# Patient Record
Sex: Male | Born: 1960 | Race: White | Hispanic: No | Marital: Married | Smoking: Never smoker
Health system: Southern US, Community
[De-identification: ages and names within clinical notes are randomized; demographics above are authoritative.]

## PROBLEM LIST (undated history)

## (undated) DIAGNOSIS — I10 Essential (primary) hypertension: Secondary | ICD-10-CM

---

## 2018-08-13 ENCOUNTER — Other Ambulatory Visit: Payer: Self-pay

## 2018-08-13 ENCOUNTER — Emergency Department (HOSPITAL_COMMUNITY): Payer: Self-pay

## 2018-08-13 ENCOUNTER — Emergency Department (HOSPITAL_COMMUNITY)
Admission: EM | Admit: 2018-08-13 | Discharge: 2018-08-14 | Disposition: A | Discharge status: Home / Self Care | Payer: Self-pay | Attending: Emergency Medicine | Admitting: Emergency Medicine

## 2018-08-13 DIAGNOSIS — M25561 Pain in right knee: Secondary | ICD-10-CM | POA: Insufficient documentation

## 2018-08-13 NOTE — ED Provider Notes (Signed)
[facility]-EMERGENCY DEPT Provider Note   CSN: [id number] Arrival date & time: 08/13/18  2043     History   Chief Complaint Chief Complaint  Patient presents with  . Knee Pain    R    HPI Marcus Scott is a 57 y.o. male.  The history is provided by the patient and medical records.  Knee Pain       57 y.o. M here with right knee pain.  States he was standing on top of a 4 rung ladder and was attempting to back down when he missed the bottom rung, fell to the ground and feels like he hyperextended his knee.  States he tried to get up and walk but his knee felt very "wobbly".  Did have a set of crutches at home which he used to help assist him to the car.  States if he lays his leg in a certain position he has excruciating pain, otherwise no pain.  He denies any numbness or weakness of his right leg.  No prior right knee injuries or surgeries in the past.  No past medical history on file.  There are no active problems to display for this patient.      Home Medications    Prior to Admission medications   Not on File    Family History No family history on file.  Social History Social History   Tobacco Use  . Smoking status: Not on file  Substance Use Topics  . Alcohol use: Not on file  . Drug use: Not on file     Allergies   Patient has no known allergies.   Review of Systems Review of Systems  Musculoskeletal: Positive for arthralgias and joint swelling.  All other systems reviewed and are negative.    Physical Exam Updated Vital Signs BP (!) 209/126 (BP Location: Left Arm)   Pulse 83   SpO2 97%   Physical Exam  Constitutional: He is oriented to person, place, and time. He appears well-developed and well-nourished.  HENT:  Head: Normocephalic and atraumatic.  Mouth/Throat: Oropharynx is clear and moist.  Eyes: Pupils are equal, round, and reactive to light. Conjunctivae and EOM are normal.  Neck: Normal range of  motion.  Cardiovascular: Normal rate, regular rhythm and normal heart sounds.  Pulmonary/Chest: Effort normal and breath sounds normal. No stridor. No respiratory distress.  Abdominal: Soft. Bowel sounds are normal. There is no tenderness. There is no rebound.  Musculoskeletal: Normal range of motion.  Right knee with small prepatellar effusion noted, there is no significant bony deformity, pain with attempted flexion but comfortable in full extension, no pain with varus or valgus stress, negative anterior drawer, pain with posterior drawer test; normal distal sensation and perfusion, moving toes normally  Neurological: He is alert and oriented to person, place, and time.  Skin: Skin is warm and dry.  Psychiatric: He has a normal mood and affect.  Nursing note and vitals reviewed.    ED Treatments / Results  Labs (all labs ordered are listed, but only abnormal results are displayed) Labs Reviewed - No data to display  EKG None  Radiology Dg Knee Complete 4 Views Right  Result Date: 08/13/2018 CLINICAL DATA:  Fall from ladder hyperextending right knee. EXAM: RIGHT KNEE - COMPLETE 4+ VIEW COMPARISON:  None. FINDINGS: No fracture or dislocation. No joint effusion or evidence of lipohemarthrosis. 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 There is minimal spurring pf  the tibial spines. No evidence of chondrocalcinosis. Regional soft tissues appear normal. IMPRESSION: 1. No acute findings. 2. 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 Electronically Signed   By: Simonne Come M.D.   On: 08/13/2018 21:57    Procedures Procedures (including critical care time)  Medications Ordered in ED Medications - No data to display   Initial Impression / Assessment and Plan / ED Course  I have reviewed the triage vital signs and the nursing notes.  Pertinent labs & imaging results that were available during my care of  the patient were reviewed by me and considered in my medical decision making (see chart for details).  57 year old male here with right knee pain after slipping off the running of a ladder.  Fell and hyperextended right knee.  No head injury or loss of consciousness.  States when trying to get up and walk knee feels very "wobbly".  Denies any numbness or weakness.  Right knee does have small prepatellar effusion but no acute deformity.  Has pain with posterior drawer but negative anterior drawer and no pain with varus or valgus stress.  X-ray is negative.  Given the nature of his injury and findings on exam concern for possible internal derangement.  We will place a knee immobilizer.  He has crutches at home that he will use.  We will have him ice and elevate at home as well as pain control.  He will follow-up closely with orthopedics.  He will return here for any new or worsening symptoms.  Final Clinical Impressions(s) / ED Diagnoses   Final diagnoses:  Acute pain of right knee    ED Discharge Orders         Ordered    oxyCODONE-acetaminophen (PERCOCET) 5-325 MG tablet  Every 4 hours PRN     08/14/18 0032    ibuprofen (ADVIL,MOTRIN) 800 MG tablet  3 times daily     08/14/18 0032           Garlon Hatchet, PA-C 08/14/18 0036    Gilda Crease, MD 08/14/18 [phone]

## 2018-08-13 NOTE — ED Triage Notes (Signed)
Pt reports that he missed 2 rungs on a ladder and slid down twisting his R knee in the process. He denies any other injury. Unable to bear weight on his R knee. Pt is able to wiggle toes and move his ankle. A&Ox4.

## 2018-08-14 MED ORDER — IBUPROFEN 800 MG PO TABS: 800.0000 mg | ORAL_TABLET | Freq: Three times a day (TID) | ORAL | 0 refills | Status: DC

## 2018-08-14 MED ORDER — OXYCODONE-ACETAMINOPHEN 5-325 MG PO TABS: 1.0000 | ORAL_TABLET | ORAL | 0 refills | Status: DC | PRN

## 2018-08-14 NOTE — ED Notes (Signed)
Pt aware of bp and stated "they would rather follow up with their pcp." Verbalized understanding of discharge paperwork and follow up care

## 2018-08-14 NOTE — Discharge Instructions (Signed)
Take the prescribed medication as directed.  Ice and elevate knee at home to help with pain control. Follow-up with orthopedics-- call for appt in the morning. Return to the ED for new or worsening symptoms.

## 2018-08-28 ENCOUNTER — Other Ambulatory Visit: Payer: Self-pay | Admitting: Physician Assistant

## 2018-08-28 DIAGNOSIS — M25561 Pain in right knee: Secondary | ICD-10-CM

## 2018-09-01 ENCOUNTER — Ambulatory Visit
Admission: RE | Admit: 2018-09-01 | Discharge: 2018-09-01 | Disposition: A | Discharge status: Home / Self Care | Payer: No Typology Code available for payment source | Source: Ambulatory Visit | Attending: Physician Assistant | Admitting: Physician Assistant

## 2018-09-01 DIAGNOSIS — M25561 Pain in right knee: Secondary | ICD-10-CM

## 2019-05-15 IMAGING — CR DG KNEE COMPLETE 4+V*R*
4 series · 4 of 4 positions shown · non-contrast
Comparison: None.

CLINICAL DATA: Fall from ladder hyperextending right knee.

EXAM:
RIGHT KNEE - COMPLETE 4+ VIEW

[t knee ap right]
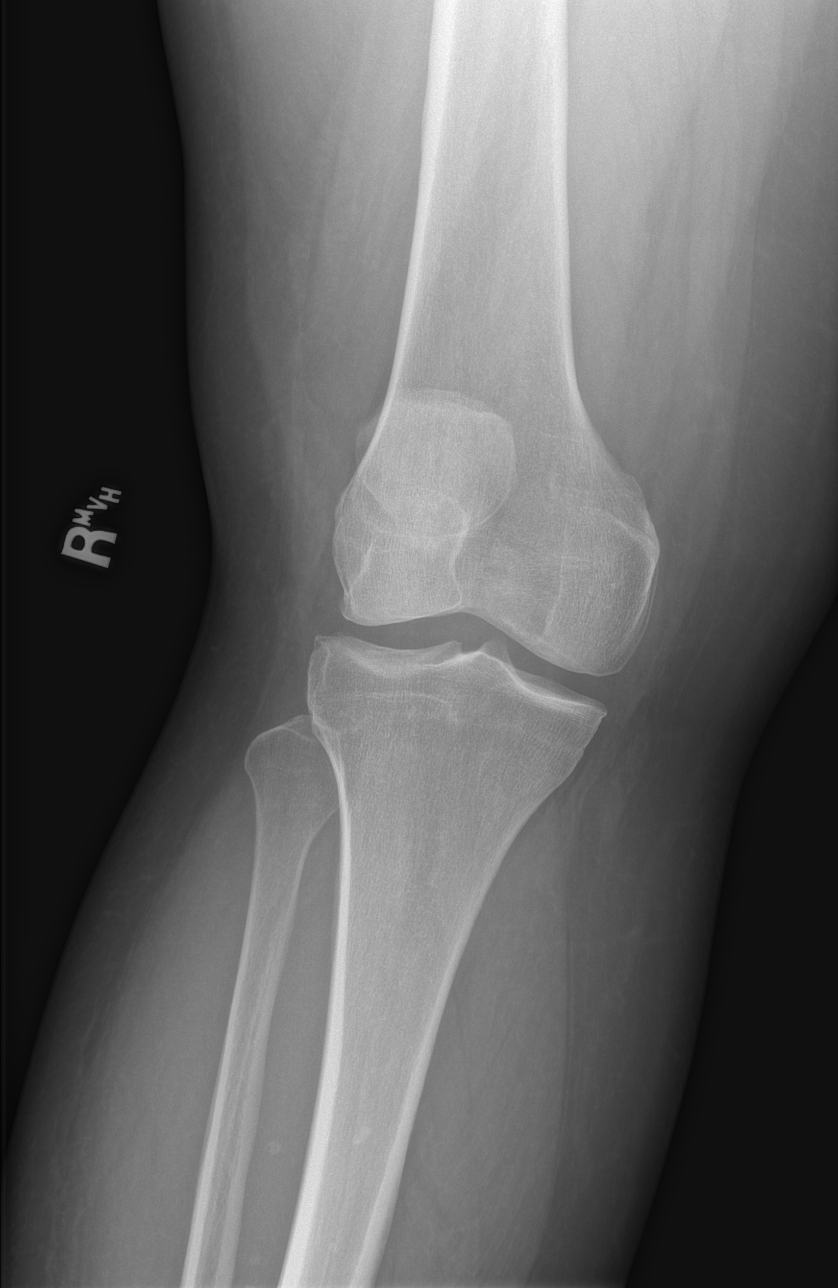

[t knee obl right (1 of 2)]
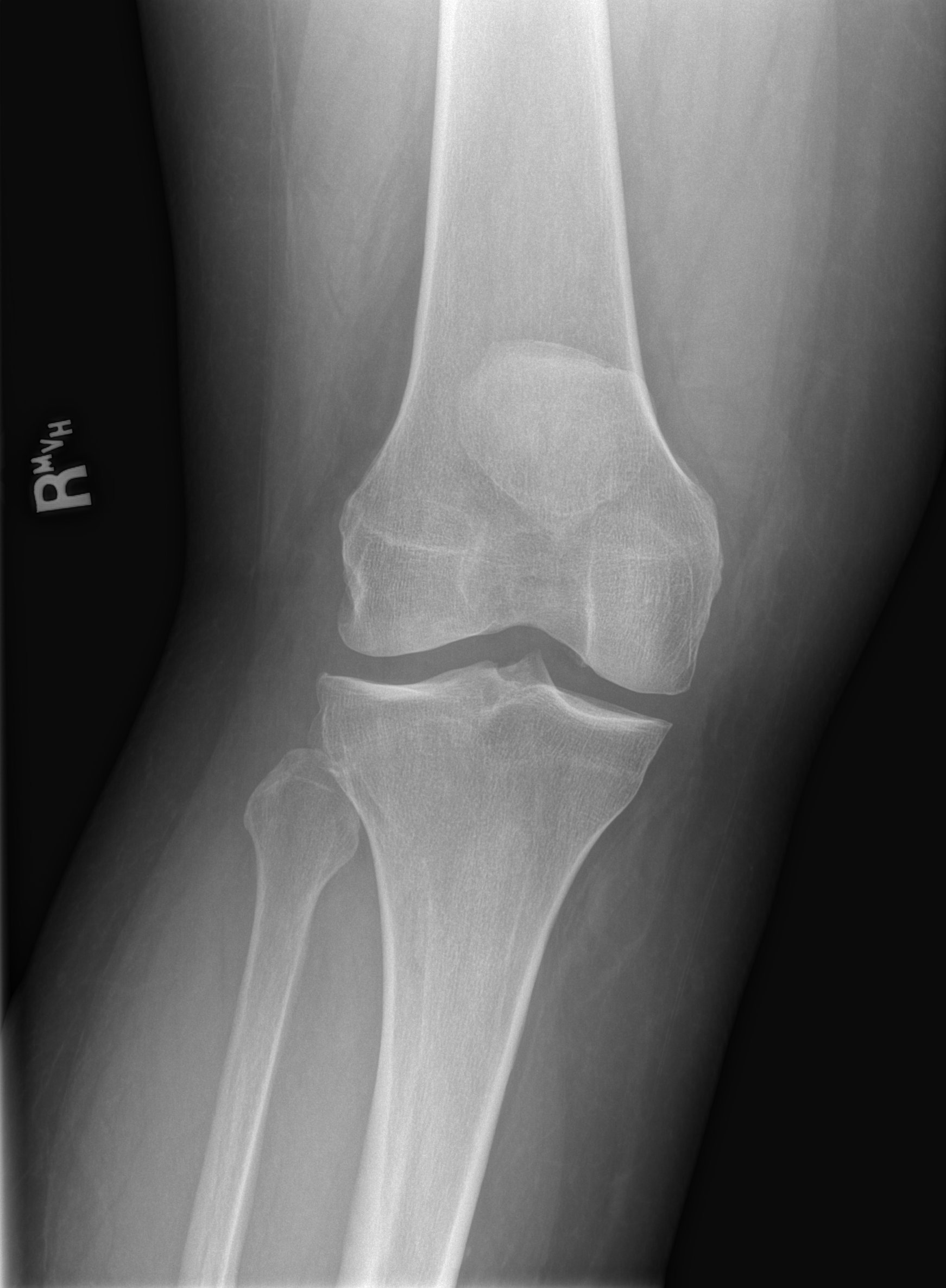

[t knee obl right (2 of 2)]
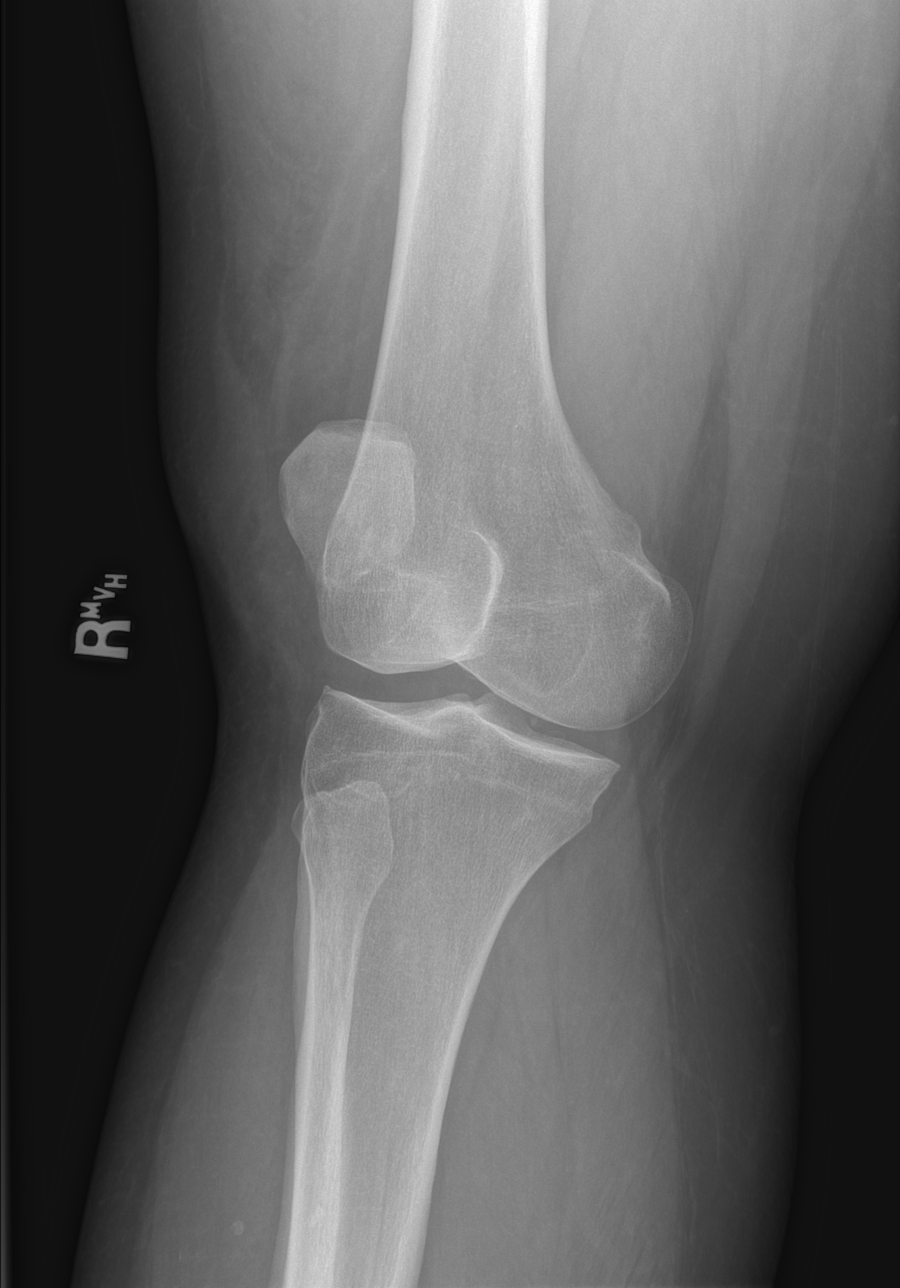

[x knee ap right]
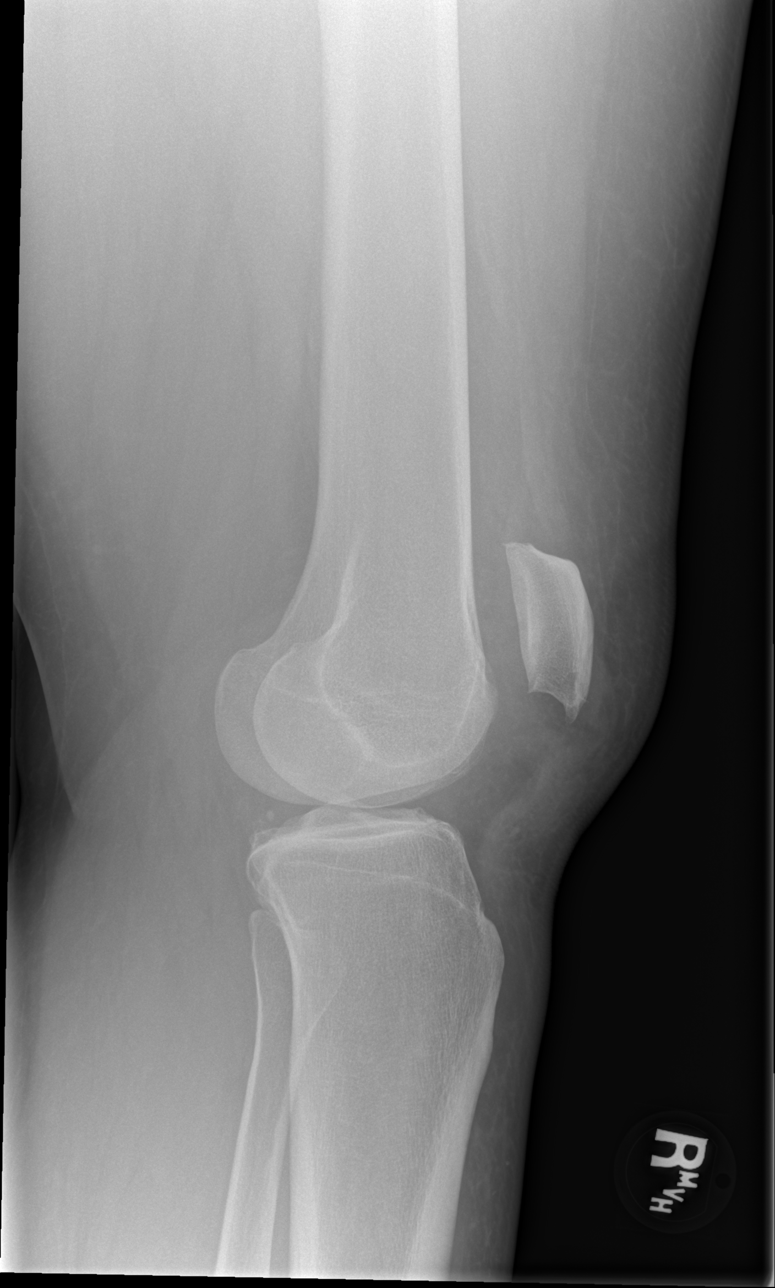

[4 of 4 positions shown; findings below may reference images not displayed]

FINDINGS: No fracture or dislocation. No joint effusion or evidence of
lipohemarthrosis.

Mild tricompartmental degenerative change of the knee with joint
space loss, subchondral sclerosis osteophytosis. There is minimal
spurring pf the tibial spines. No evidence of chondrocalcinosis.
Regional soft tissues appear normal.
IMPRESSION: 1. No acute findings.
2. Mild tricompartmental degenerative change of the knee.

## 2019-10-03 HISTORY — PX: PATELLA RECONSTRUCTION: SHX736

## 2024-01-19 ENCOUNTER — Encounter (HOSPITAL_BASED_OUTPATIENT_CLINIC_OR_DEPARTMENT_OTHER): Payer: Self-pay | Admitting: Emergency Medicine

## 2024-01-19 ENCOUNTER — Emergency Department (HOSPITAL_BASED_OUTPATIENT_CLINIC_OR_DEPARTMENT_OTHER)
Admission: EM | Admit: 2024-01-19 | Discharge: 2024-01-19 | Disposition: A | Discharge status: Home / Self Care | Payer: Self-pay | Attending: Emergency Medicine | Admitting: Emergency Medicine

## 2024-01-19 ENCOUNTER — Other Ambulatory Visit: Payer: Self-pay

## 2024-01-19 ENCOUNTER — Emergency Department (HOSPITAL_BASED_OUTPATIENT_CLINIC_OR_DEPARTMENT_OTHER): Payer: Self-pay

## 2024-01-19 DIAGNOSIS — S66821A Laceration of other specified muscles, fascia and tendons at wrist and hand level, right hand, initial encounter: Secondary | ICD-10-CM | POA: Insufficient documentation

## 2024-01-19 DIAGNOSIS — S61411A Laceration without foreign body of right hand, initial encounter: Secondary | ICD-10-CM

## 2024-01-19 DIAGNOSIS — W312XXA Contact with powered woodworking and forming machines, initial encounter: Secondary | ICD-10-CM | POA: Insufficient documentation

## 2024-01-19 MED ORDER — CEPHALEXIN 500 MG PO CAPS: 500.0000 mg | ORAL_CAPSULE | Freq: Three times a day (TID) | ORAL | 0 refills | Status: DC

## 2024-01-19 MED ORDER — OXYCODONE-ACETAMINOPHEN 5-325 MG PO TABS: 1.0000 | ORAL_TABLET | Freq: Four times a day (QID) | ORAL | 0 refills | Status: DC | PRN

## 2024-01-19 MED ORDER — LIDOCAINE HCL 2 % IJ SOLN
10.0000 mL | Freq: Once | INTRAMUSCULAR | Status: AC
  Administered 2024-01-19: 200 mg
  Filled 2024-01-19: qty 20

## 2024-01-19 MED ORDER — CEPHALEXIN 250 MG PO CAPS
500.0000 mg | ORAL_CAPSULE | Freq: Once | ORAL | Status: AC
  Administered 2024-01-19: 500 mg via ORAL
  Filled 2024-01-19: qty 2

## 2024-01-19 MED ORDER — OXYCODONE-ACETAMINOPHEN 5-325 MG PO TABS
2.0000 | ORAL_TABLET | Freq: Once | ORAL | Status: AC
  Administered 2024-01-19: 2 via ORAL
  Filled 2024-01-19: qty 2

## 2024-01-19 NOTE — Discharge Instructions (Signed)
 As we discussed, your third finger extensor tendon is cut.  The orthopedic doctor recommend that you keep the splint in place and take Keflex  3 times daily for a week.  You can also take Tylenol  or Motrin  for pain and Percocet for severe pain  Please do not get the splint wet and put a plastic bag around it when you shower  Please call Dr. Berl Breed office on Monday for appointment next week for tendon surgery   Return to ER if you have worse hand pain, bleeding, fingers turning blue

## 2024-01-19 NOTE — ED Provider Notes (Addendum)
 [place] EMERGENCY DEPARTMENT AT [facility] Provider Note   CSN: [id number] Arrival date & time: 01/19/24  1856     History  Chief Complaint  Patient presents with   Extremity Laceration    Marcus Scott is a 63 y.o. male here presenting with right hand laceration.  Patient states that he was using a chop saw and it bounced back and cut him on the right hand.  Patient states that he had decreased sensation of the hand.  He states that he also is unable to extend his right third finger.  Tetanus is up-to-date 4 years ago.  The history is provided by the patient.       Home Medications Prior to Admission medications   Medication Sig Start Date End Date Taking? Authorizing Provider  ibuprofen  (ADVIL ,MOTRIN ) 800 MG tablet Take 1 tablet (800 mg total) by mouth 3 (three) times daily. 08/14/18   Coretha Dew, PA-C  oxyCODONE -acetaminophen  (PERCOCET) 5-325 MG tablet Take 1 tablet by mouth every 4 (four) hours as needed. 08/14/18   Coretha Dew, PA-C      Allergies    Patient has no known allergies.    Review of Systems   Review of Systems  Skin:  Positive for wound.  All other systems reviewed and are negative.   Physical Exam Updated Vital Signs BP (!) 198/117   Pulse 74   Temp 97.9 F (36.6 C) (Oral)   Resp 16   Ht 5\' 6"  (1.676 m)   Wt 117.9 kg   SpO2 95%   BMI 41.97 kg/m  Physical Exam Vitals and nursing note reviewed.  HENT:     Head: Normocephalic.     Nose: Nose normal.     Mouth/Throat:     Mouth: Mucous membranes are moist.     Pharynx: Oropharynx is clear.  Eyes:     Pupils: Pupils are equal, round, and reactive to light.  Cardiovascular:     Rate and Rhythm: Normal rate.     Pulses: Normal pulses.  Pulmonary:     Effort: Pulmonary effort is normal.  Abdominal:     General: Abdomen is flat.  Musculoskeletal:     Cervical back: Normal range of motion.     Comments: Large 10 cm laceration on the dorsal aspect of the right hand.   I can see a tendon that appears to.  Patient is unable to completely extend his right third finger.  Patient is able to flex right third finger however.  Patient is able to flex and extend all the other fingers.  Patient has 2+ DP pulse and also normal capillary refills on all of the fingers  Skin:    General: Skin is warm.  Neurological:     General: No focal deficit present.     Mental Status: He is alert.  Psychiatric:        Mood and Affect: Mood normal.     ED Results / Procedures / Treatments   Labs (all labs ordered are listed, but only abnormal results are displayed) Labs Reviewed - No data to display  EKG None  Radiology No results found.  Procedures Procedures      LACERATION REPAIR Performed by: Florette Hurry Authorized by: Florette Hurry Consent: Verbal consent obtained. Risks and benefits: risks, benefits and alternatives were discussed Consent given by: patient Patient identity confirmed: provided demographic data Prepped and Draped in normal sterile fashion Wound explored  Laceration Location: R hand   Laceration  Length: 10 cm  No Foreign Bodies seen or palpated  Anesthesia: local infiltration  Local anesthetic: lidocaine  2% no epinephrine   Anesthetic total: 10 ml  Irrigation method: syringe Amount of cleaning: standard  Skin closure: 4-0 ethilon  Number of sutures: 11  Technique: simple interrupted   Patient tolerance: Patient tolerated the procedure well with no immediate complications.  Medications Ordered in ED Medications  lidocaine  (XYLOCAINE ) 2 % (with pres) injection 200 mg (has no administration in time range)  oxyCODONE -acetaminophen  (PERCOCET/ROXICET) 5-325 MG per tablet 2 tablet (has no administration in time range)  cephALEXin  (KEFLEX ) capsule 500 mg (has no administration in time range)    ED Course/ Medical Decision Making/ A&P                                 Medical Decision Making Marcus Scott is a 63 y.o. male  here with right hand laceration.  Patient appears to have a extensor tendon that was cut already.  Patient is unable to fully extend the right third finger.  I discussed with the hand surgeon on-call, Dr. Marce Scott.  He recommended extensive washout at bedside and sutured the skin.  He states that extensor tendons can be repaired next week.  He states that patient can follow-up with him next week and recommend antibiotics and pain medicine and splint in extension so the tendon won't retract   8:10 PM I reviewed patient's x-ray and there were no fractures.  Patient was splinted by the tech and remains vascularly intact.  Patient will be discharged home with Keflex  and Percocet for pain.  8:36 PM Radiology read the x-ray and there was questionable foreign body versus tiny cortical fracture fragments.  I was able to to washout the hand with 1 L normal saline I did not see any foreign body.  Problems Addressed: Extensor tendon laceration of right hand with open wound, initial encounter: acute illness or injury Laceration of right hand without foreign body, initial encounter: acute illness or injury  Amount and/or Complexity of Data Reviewed Radiology: ordered and independent interpretation performed. Decision-making details documented in ED Course.  Risk Prescription drug management.    Final Clinical Impression(s) / ED Diagnoses Final diagnoses:  None    Rx / DC Orders ED Discharge Orders     None         Dalene Duck, MD 01/19/24 2011    Dalene Duck, MD 01/19/24 2037

## 2024-01-19 NOTE — ED Triage Notes (Addendum)
 Patient here POV after having a chop saw kicked back on him and cutting the top of his right hand through the middle finger deeply. Patient states he has full sensation of his hand/ fingers and is able to wiggle his fingers. States his last tetanus was 4 years ago.

## 2024-01-22 ENCOUNTER — Ambulatory Visit (INDEPENDENT_AMBULATORY_CARE_PROVIDER_SITE_OTHER): Payer: Self-pay | Admitting: Orthopedic Surgery

## 2024-01-22 ENCOUNTER — Encounter (HOSPITAL_BASED_OUTPATIENT_CLINIC_OR_DEPARTMENT_OTHER)
Admission: RE | Admit: 2024-01-22 | Discharge: 2024-01-22 | Disposition: A | Discharge status: Home / Self Care | Payer: Self-pay | Source: Ambulatory Visit | Attending: Orthopedic Surgery | Admitting: Orthopedic Surgery

## 2024-01-22 ENCOUNTER — Encounter (HOSPITAL_BASED_OUTPATIENT_CLINIC_OR_DEPARTMENT_OTHER): Payer: Self-pay | Admitting: Orthopedic Surgery

## 2024-01-22 ENCOUNTER — Other Ambulatory Visit: Payer: Self-pay

## 2024-01-22 DIAGNOSIS — T8859XA Other complications of anesthesia, initial encounter: Secondary | ICD-10-CM

## 2024-01-22 DIAGNOSIS — Z01810 Encounter for preprocedural cardiovascular examination: Secondary | ICD-10-CM | POA: Insufficient documentation

## 2024-01-22 DIAGNOSIS — I10 Essential (primary) hypertension: Secondary | ICD-10-CM | POA: Insufficient documentation

## 2024-01-22 DIAGNOSIS — R9431 Abnormal electrocardiogram [ECG] [EKG]: Secondary | ICD-10-CM | POA: Insufficient documentation

## 2024-01-22 DIAGNOSIS — S61401A Unspecified open wound of right hand, initial encounter: Secondary | ICD-10-CM

## 2024-01-22 DIAGNOSIS — S66821A Laceration of other specified muscles, fascia and tendons at wrist and hand level, right hand, initial encounter: Secondary | ICD-10-CM

## 2024-01-22 HISTORY — DX: Other complications of anesthesia, initial encounter: T88.59XA

## 2024-01-22 NOTE — H&P (View-Only) (Signed)
 Marcus Scott - 63 y.o. male MRN [id number]  Date of birth: 1961-05-25  Office Visit Note: Visit Date: 01/22/2024 PCP: Patient, No Pcp Per Referred by: No ref. provider found  Subjective: No chief complaint on file.  HPI: Marcus Scott is a pleasant 63 y.o. male who presents today for evaluation of a right hand laceration sustained 2 days prior.  Injury mechanism described as using a chop saw, bounced back and cut him on the right hand.  He has notable longitudinal laceration over the dorsal aspect of the right hand in line with the long finger.  He was seen in the emergency department setting the day of injury, underwent bedside irrigation and closure.  At that time, upon emergency department evaluation, there was concern for a's of tendon disruption of the long finger.  Currently, he is unable to perform extension of the long finger.  Denies any significant numbness or tingling, no other significant sites of injury.  Pertinent ROS were reviewed with the patient and found to be negative unless otherwise specified above in HPI.   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 Extensor tendon laceration, hand, open wound, right, initial encounter     Plan: Based on his clinical examination today which is consistent with his emergency department workup, there is concern for extensor tendon laceration to the right long finger.  We discussed that if the extensor tendon is in discontinuity, this will preclude the ability for appropriate healing and will lead to lack of function with extension of the long finger long-term.  From a treatment standpoint, patient is  indicated for right hand wound exploration and likely extensor tendon repair, all other indicated procedures based on exploration.  Risks and benefits of the procedure were discussed, risks including but not limited to infection, bleeding, scarring, stiffness, nerve injury, tendon injury, vascular injury, extensor lag, lack of mobility or grip strength, recurrence of symptoms and need for subsequent operation.  We also discussed the appropriate postoperative protocol and timeframe for return to activities and function.  Patient expressed understanding.  He elected to proceed with surgical scheduling.  Follow-up: No follow-ups on file.   Meds & Orders: No orders of the defined types were placed in this encounter.  No orders of the defined types were placed in this encounter.    Procedures: No procedures performed      Clinical History: No specialty comments available.  He reports that he has never smoked. He has never used smokeless tobacco. No results for input(s): "HGBA1C", "LABURIC" in the last 8760 hours.  Objective:   Vital Signs: There were no vitals taken for this visit.  Physical Exam  Gen: Well-appearing, in no acute distress; non-toxic CV: Regular Rate. Well-perfused. Warm.  Resp: Breathing unlabored on room air; no wheezing. Psych: Fluid speech in conversation; appropriate affect; normal thought process  Ortho Exam Right ankle - 7 cm laceration over the dorsal aspect of the hand in line with the long finger, stemming from the area just proximal to the MCP joint, slightly curvilinear nature, laceration stops distal to the wrist crease - Unable to perform active extension of the long finger, tenodesis effect demonstrates significant lag of the long finger extension - Sensation intact distally median/radial/ulnar distributions -  AIN/PIN interosseous intact - Hand remains warm well-perfused - Skin edges well-approximated laceration site, sutures in place, no erythema or  drainage  Imaging: No results found. Images from the emergency department setting of the right hand were reviewed, no significant fracture or dislocation, unable to discern any specific foreign body   Past Medical/Family/Surgical/Social History: Medications & Allergies reviewed per EMR, new medications updated. There are no active problems to display for this patient.  Past Medical History:  Diagnosis Date   Complication of anesthesia 01/22/2024   hard to wake up, states was depressed 2-3 weeks after knee sx 24yrs ago   Hypertension    STATES BP HAS BEEN HIGH FOR QUIET SOME TIME, NO MEDS, NO PCP   No family history on file. Past Surgical History:  Procedure Laterality Date   PATELLA RECONSTRUCTION Right 2021   at [facility]   Social History   Occupational History   Not on file  Tobacco Use   Smoking status: Never   Smokeless tobacco: Never  Substance and Sexual Activity   Alcohol use: Not Currently   Drug use: Never   Sexual activity: Yes    Rhyse Skowron Merlinda Starling) Marce Sensing, M.D.  [facility]

## 2024-01-22 NOTE — Progress Notes (Signed)
 EKG completed, BMI updated, Blood pressure- 190/92, 174/94, 172/92. Patient denies headaches, blurred vision or dizziness. Patient states does not have PCP and is not on any medications for controlling blood pressure. Dr. Leilani Punter evaluated patient, EKG, and blood pressures. Dr. Leilani Punter states the case should be moved to Main OR. Patient aware and April at Dr. Marce Sensing office is also aware.

## 2024-01-22 NOTE — Progress Notes (Signed)
 Marcus Scott - 63 y.o. male MRN [id number]  Date of birth: 1961-05-25  Office Visit Note: Visit Date: 01/22/2024 PCP: Patient, No Pcp Per Referred by: No ref. provider found  Subjective: No chief complaint on file.  HPI: Marcus Scott is a pleasant 63 y.o. male who presents today for evaluation of a right hand laceration sustained 2 days prior.  Injury mechanism described as using a chop saw, bounced back and cut him on the right hand.  He has notable longitudinal laceration over the dorsal aspect of the right hand in line with the long finger.  He was seen in the emergency department setting the day of injury, underwent bedside irrigation and closure.  At that time, upon emergency department evaluation, there was concern for a's of tendon disruption of the long finger.  Currently, he is unable to perform extension of the long finger.  Denies any significant numbness or tingling, no other significant sites of injury.  Pertinent ROS were reviewed with the patient and found to be negative unless otherwise specified above in HPI.   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 Extensor tendon laceration, hand, open wound, right, initial encounter     Plan: Based on his clinical examination today which is consistent with his emergency department workup, there is concern for extensor tendon laceration to the right long finger.  We discussed that if the extensor tendon is in discontinuity, this will preclude the ability for appropriate healing and will lead to lack of function with extension of the long finger long-term.  From a treatment standpoint, patient is  indicated for right hand wound exploration and likely extensor tendon repair, all other indicated procedures based on exploration.  Risks and benefits of the procedure were discussed, risks including but not limited to infection, bleeding, scarring, stiffness, nerve injury, tendon injury, vascular injury, extensor lag, lack of mobility or grip strength, recurrence of symptoms and need for subsequent operation.  We also discussed the appropriate postoperative protocol and timeframe for return to activities and function.  Patient expressed understanding.  He elected to proceed with surgical scheduling.  Follow-up: No follow-ups on file.   Meds & Orders: No orders of the defined types were placed in this encounter.  No orders of the defined types were placed in this encounter.    Procedures: No procedures performed      Clinical History: No specialty comments available.  He reports that he has never smoked. He has never used smokeless tobacco. No results for input(s): "HGBA1C", "LABURIC" in the last 8760 hours.  Objective:   Vital Signs: There were no vitals taken for this visit.  Physical Exam  Gen: Well-appearing, in no acute distress; non-toxic CV: Regular Rate. Well-perfused. Warm.  Resp: Breathing unlabored on room air; no wheezing. Psych: Fluid speech in conversation; appropriate affect; normal thought process  Ortho Exam Right ankle - 7 cm laceration over the dorsal aspect of the hand in line with the long finger, stemming from the area just proximal to the MCP joint, slightly curvilinear nature, laceration stops distal to the wrist crease - Unable to perform active extension of the long finger, tenodesis effect demonstrates significant lag of the long finger extension - Sensation intact distally median/radial/ulnar distributions -  AIN/PIN interosseous intact - Hand remains warm well-perfused - Skin edges well-approximated laceration site, sutures in place, no erythema or  drainage  Imaging: No results found. Images from the emergency department setting of the right hand were reviewed, no significant fracture or dislocation, unable to discern any specific foreign body   Past Medical/Family/Surgical/Social History: Medications & Allergies reviewed per EMR, new medications updated. There are no active problems to display for this patient.  Past Medical History:  Diagnosis Date   Complication of anesthesia 01/22/2024   hard to wake up, states was depressed 2-3 weeks after knee sx 24yrs ago   Hypertension    STATES BP HAS BEEN HIGH FOR QUIET SOME TIME, NO MEDS, NO PCP   No family history on file. Past Surgical History:  Procedure Laterality Date   PATELLA RECONSTRUCTION Right 2021   at [facility]   Social History   Occupational History   Not on file  Tobacco Use   Smoking status: Never   Smokeless tobacco: Never  Substance and Sexual Activity   Alcohol use: Not Currently   Drug use: Never   Sexual activity: Yes    Rhyse Skowron Merlinda Starling) Marce Sensing, M.D.  [facility]

## 2024-01-23 ENCOUNTER — Other Ambulatory Visit: Payer: Self-pay

## 2024-01-23 ENCOUNTER — Encounter (HOSPITAL_COMMUNITY): Payer: Self-pay | Admitting: Orthopedic Surgery

## 2024-01-23 NOTE — Progress Notes (Signed)
 PCP - NO PCP Cardiologist -   PPM/ICD - denies Device Orders - n/a Rep Notified - n/a  Chest x-ray -  EKG - 01-22-24 Stress Test -  ECHO -  Cardiac Cath -   CPAP - denies  DM -denies  Blood Thinner Instructions: denies Aspirin Instructions: n/a  ERAS Protcol - clear liquids until 11:00  COVID TEST- n/a  Anesthesia review: yes hx of HTN  Patient verbally denies any shortness of breath, fever, cough and chest pain during phone call   -------------  SDW INSTRUCTIONS given:  Your procedure is scheduled on January 24, 2024.  Report to [facility] Main Entrance "A" at 11:30 A.M., and check in at the Admitting office.  Call this number if you have problems the morning of surgery:  [phone]   Remember:  Do not eat after midnight the night before your surgery  You may drink clear liquids until 11:00 the morning of your surgery.   Clear liquids allowed are: Water, Non-Citrus Juices (without pulp), Carbonated Beverages, Clear Tea, Black Coffee Only, and Gatorade    Take these medicines the morning of surgery with A SIP OF WATER  cephALEXin  (KEFLEX )  oxyCODONE -acetaminophen  (PERCOCET)   As of today, STOP taking any Aspirin (unless otherwise instructed by your surgeon) Aleve, Naproxen, Ibuprofen , Motrin , Advil , Goody's, BC's, all herbal medications, fish oil, and all vitamins.                      Do not wear jewelry, make up, or nail polish            Do not wear lotions, powders, perfumes/colognes, or deodorant.            Do not shave 48 hours prior to surgery.  Men may shave face and neck.            Do not bring valuables to the hospital.            [facility] is not responsible for any belongings or valuables.  Do NOT Smoke (Tobacco/Vaping) 24 hours prior to your procedure If you use a CPAP at night, you may bring all equipment for your overnight stay.   Contacts, glasses, dentures or bridgework may not be worn into surgery.      For patients admitted to the  hospital, discharge time will be determined by your treatment team.   Patients discharged the day of surgery will not be allowed to drive home, and someone needs to stay with them for 24 hours.    Special instructions:   [place]- Preparing For Surgery  Before surgery, you can play an important role. Because skin is not sterile, your skin needs to be as free of germs as possible. You can reduce the number of germs on your skin by washing with CHG (chlorahexidine gluconate) Soap before surgery.  CHG is an antiseptic cleaner which kills germs and bonds with the skin to continue killing germs even after washing.    Oral Hygiene is also important to reduce your risk of infection.  Remember - BRUSH YOUR TEETH THE MORNING OF SURGERY WITH YOUR REGULAR TOOTHPASTE  Please do not use if you have an allergy to CHG or antibacterial soaps. If your skin becomes reddened/irritated stop using the CHG.  Do not shave (including legs and underarms) for at least 48 hours prior to first CHG shower. It is OK to shave your face.  Please follow these instructions carefully.   Shower the [employer]  [facility] and the MORNING OF SURGERY with DIAL Soap.   Pat yourself dry with a CLEAN TOWEL.  Wear CLEAN PAJAMAS to bed the night before surgery  Place CLEAN SHEETS on your bed the night of your first shower and DO NOT SLEEP WITH PETS.   Day of Surgery: Please shower morning of surgery  Wear Clean/Comfortable clothing the morning of surgery Do not apply any deodorants/lotions.   Remember to brush your teeth WITH YOUR REGULAR TOOTHPASTE.   Questions were answered. Patient verbalized understanding of instructions.

## 2024-01-23 NOTE — Progress Notes (Signed)
 Anesthesia Chart Review: SAME DAY WORK-UP  Case: [id number] Date/Time: 01/24/24 1345   Procedure: REPAIR, TENDON, EXTENSOR (Right) - RIGHT HAND WOUND EXPLORATION WITH EXTENSOR TENDON REPAIR   Anesthesia type: Regional   Diagnosis: Laceration of extensor muscle, fascia and tendon of unspecified finger at wrist and hand level, initial encounter [S66.329A]   Pre-op diagnosis: RIGHT HAND LONF FINGER EXTENSOR TENDON LACERATION   [facility] ROOM 02 / [facility] OR   Surgeons: Merrill Abide, MD       DISCUSSION: Patient is a 63 year old male scheduled for the above procedure. He was initially scheduled as a surgical center patient, but anesthesiologist advised to move to Main OR given elevated BP without PCP or antihypertensive medications. BP documented as 190/92, 174/94, 172/92 on 01/22/24 at PST visit.   He sustained an extensive right hand laceration involving tendon injury after his chop saw bounced back and cut his hand on 01/19/24.   Other history includes never smoker, HTN (untreated). 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  He had an EKG on 01/22/24 showing NSR, LAD, minimal voltage for LVH.   He is for labs and anesthesia team evaluation on the day of surgery.   VS: BP (!) 190/92   Pulse 63   Resp 16   Ht 5\' 6"  (1.676 m)   Wt 120.4 kg   SpO2 98%   BMI 42.84 kg/m   PROVIDERS: Patient, No Pcp Per   LABS: For day of surgery as indicated.    IMAGES: Xray right hand 01/19/24: IMPRESSION: Laceration dorsum of the hand at the level of metacarpals with punctate soft tissue densities, indeterminate for foreign body or tiny cortical fracture fragments.   EKG: 01/22/24: Normal sinus rhythm Left axis deviation Minimal voltage criteria for LVH, may be normal variant ( Cornell product ) Abnormal ECG No previous ECGs available Confirmed by Peder Bourdon [phone]) on 01/22/2024 2:26:34 PM   CV: N/A   Past  Medical History:  Diagnosis Date   Complication of anesthesia 01/22/2024   hard to wake up, states was depressed 2-3 weeks after knee sx 86yrs ago   Hypertension    STATES BP HAS BEEN HIGH FOR QUIET SOME TIME, NO MEDS, NO PCP    Past Surgical History:  Procedure Laterality Date   PATELLA RECONSTRUCTION Right 2021   at [facility]    MEDICATIONS: No current facility-administered medications for this encounter.    ascorbic acid (VITAMIN C) 500 MG tablet   Vitamin D, Cholecalciferol, 25 MCG (1000 UT) CAPS   zinc gluconate 50 MG tablet   cephALEXin  (KEFLEX ) 500 MG capsule   oxyCODONE -acetaminophen  (PERCOCET) 5-325 MG tablet    Ella Gun, PA-C Surgical Short Stay/Anesthesiology [facility] Phone [phone] [facility] Phone [phone] 01/23/2024 1:07 PM

## 2024-01-23 NOTE — Anesthesia Preprocedure Evaluation (Signed)
 Anesthesia Evaluation  Patient identified by MRN, date of birth, ID band Patient awake    Reviewed: Allergy & Precautions, NPO status , Patient's Chart, lab work & pertinent test results  History of Anesthesia Complications (+) history of anesthetic complications  Airway Mallampati: II       Dental no notable dental hx. (+) Teeth Intact, Dental Advisory Given   Pulmonary neg pulmonary ROS   Pulmonary exam normal breath sounds clear to auscultation       Cardiovascular hypertension, Normal cardiovascular exam Rhythm:Regular Rate:Normal     Neuro/Psych negative neurological ROS  negative psych ROS   GI/Hepatic negative GI ROS, Neg liver ROS,,,  Endo/Other    Class 3 obesity  Renal/GU negative Renal ROS  negative genitourinary   Musculoskeletal Extensor tendon laceration right long finger   Abdominal  (+) + obese  Peds  Hematology negative hematology ROS (+)   Anesthesia Other Findings   Reproductive/Obstetrics                              Anesthesia Physical Anesthesia Plan  ASA: 3  Anesthesia Plan: Regional and MAC   Post-op Pain Management: Regional block* and Minimal or no pain anticipated   Induction: Intravenous  PONV Risk Score and Plan: 2 and Treatment may vary due to age or medical condition and Ondansetron   Airway Management Planned: Natural Airway and Simple Face Mask  Additional Equipment: None  Intra-op Plan:   Post-operative Plan:   Informed Consent: I have reviewed the patients History and Physical, chart, labs and discussed the procedure including the risks, benefits and alternatives for the proposed anesthesia with the patient or authorized representative who has indicated his/her understanding and acceptance.     Dental advisory given  Plan Discussed with: CRNA and Anesthesiologist  Anesthesia Plan Comments: (PAT note written 01/23/2024 by Allison  Zelenak, PA-C.  )        Anesthesia Quick Evaluation

## 2024-01-24 ENCOUNTER — Ambulatory Visit (HOSPITAL_COMMUNITY)
Admission: RE | Admit: 2024-01-24 | Discharge: 2024-01-24 | Disposition: A | Discharge status: Home / Self Care | Payer: Self-pay | Attending: Orthopedic Surgery | Admitting: Orthopedic Surgery

## 2024-01-24 ENCOUNTER — Ambulatory Visit (HOSPITAL_COMMUNITY): Payer: Self-pay | Admitting: Anesthesiology

## 2024-01-24 ENCOUNTER — Encounter (HOSPITAL_COMMUNITY)
Admission: RE | Disposition: A | Discharge status: Home / Self Care | Payer: Self-pay | Source: Home / Self Care | Attending: Orthopedic Surgery

## 2024-01-24 ENCOUNTER — Other Ambulatory Visit: Payer: Self-pay

## 2024-01-24 ENCOUNTER — Encounter (HOSPITAL_COMMUNITY): Payer: Self-pay | Admitting: Orthopedic Surgery

## 2024-01-24 DIAGNOSIS — S61411A Laceration without foreign body of right hand, initial encounter: Secondary | ICD-10-CM

## 2024-01-24 DIAGNOSIS — Z6841 Body Mass Index (BMI) 40.0 and over, adult: Secondary | ICD-10-CM

## 2024-01-24 DIAGNOSIS — S66322A Laceration of extensor muscle, fascia and tendon of right middle finger at wrist and hand level, initial encounter: Secondary | ICD-10-CM

## 2024-01-24 DIAGNOSIS — W270XXA Contact with workbench tool, initial encounter: Secondary | ICD-10-CM | POA: Insufficient documentation

## 2024-01-24 DIAGNOSIS — S66821A Laceration of other specified muscles, fascia and tendons at wrist and hand level, right hand, initial encounter: Secondary | ICD-10-CM

## 2024-01-24 DIAGNOSIS — S66329A Laceration of extensor muscle, fascia and tendon of unspecified finger at wrist and hand level, initial encounter: Secondary | ICD-10-CM | POA: Insufficient documentation

## 2024-01-24 DIAGNOSIS — I10 Essential (primary) hypertension: Secondary | ICD-10-CM

## 2024-01-24 HISTORY — PX: REPAIR EXTENSOR TENDON: SHX5382

## 2024-01-24 HISTORY — DX: Essential (primary) hypertension: I10

## 2024-01-24 SURGERY — REPAIR, TENDON, EXTENSOR
Anesthesia: Monitor Anesthesia Care | Laterality: Right

## 2024-01-24 MED ORDER — OXYCODONE HCL 5 MG PO TABS: 5.0000 mg | ORAL_TABLET | Freq: Four times a day (QID) | ORAL | 0 refills | Status: DC | PRN

## 2024-01-24 MED ORDER — LACTATED RINGERS IV SOLN: INTRAVENOUS | Status: DC

## 2024-01-24 MED ORDER — BUPIVACAINE HCL (PF) 0.5 % IJ SOLN
INTRAMUSCULAR | Status: DC | PRN
  Administered 2024-01-24: 30 mL via PERINEURAL

## 2024-01-24 MED ORDER — CEFAZOLIN SODIUM-DEXTROSE 3-4 GM/150ML-% IV SOLN
3.0000 g | INTRAVENOUS | Status: AC
  Administered 2024-01-24: 3 g via INTRAVENOUS
  Filled 2024-01-24: qty 150

## 2024-01-24 MED ORDER — PROPOFOL 500 MG/50ML IV EMUL
INTRAVENOUS | Status: DC | PRN
  Administered 2024-01-24: 100 ug/kg/min via INTRAVENOUS

## 2024-01-24 MED ORDER — FENTANYL CITRATE (PF) 100 MCG/2ML IJ SOLN
INTRAMUSCULAR | Status: AC
  Administered 2024-01-24: 50 ug via INTRAVENOUS
  Filled 2024-01-24: qty 2

## 2024-01-24 MED ORDER — FENTANYL CITRATE (PF) 100 MCG/2ML IJ SOLN: 25.0000 ug | INTRAMUSCULAR | Status: DC | PRN

## 2024-01-24 MED ORDER — MIDAZOLAM HCL 2 MG/2ML IJ SOLN: 2.0000 mg | Freq: Once | INTRAMUSCULAR | Status: AC

## 2024-01-24 MED ORDER — 0.9 % SODIUM CHLORIDE (POUR BTL) OPTIME
TOPICAL | Status: DC | PRN
  Administered 2024-01-24 (×2): 1000 mL

## 2024-01-24 MED ORDER — OXYCODONE HCL 5 MG PO TABS: 5.0000 mg | ORAL_TABLET | Freq: Once | ORAL | Status: DC | PRN

## 2024-01-24 MED ORDER — FENTANYL CITRATE (PF) 100 MCG/2ML IJ SOLN: 50.0000 ug | Freq: Once | INTRAMUSCULAR | Status: AC

## 2024-01-24 MED ORDER — ORAL CARE MOUTH RINSE: 15.0000 mL | Freq: Once | OROMUCOSAL | Status: AC

## 2024-01-24 MED ORDER — ONDANSETRON HCL 4 MG/2ML IJ SOLN
INTRAMUSCULAR | Status: DC | PRN
  Administered 2024-01-24: 4 mg via INTRAVENOUS

## 2024-01-24 MED ORDER — CHLORHEXIDINE GLUCONATE 0.12 % MT SOLN
15.0000 mL | Freq: Once | OROMUCOSAL | Status: AC
  Administered 2024-01-24: 15 mL via OROMUCOSAL
  Filled 2024-01-24: qty 15

## 2024-01-24 MED ORDER — MIDAZOLAM HCL 2 MG/2ML IJ SOLN
INTRAMUSCULAR | Status: AC
  Administered 2024-01-24: 2 mg via INTRAVENOUS
  Filled 2024-01-24: qty 2

## 2024-01-24 MED ORDER — OXYCODONE HCL 5 MG/5ML PO SOLN: 5.0000 mg | Freq: Once | ORAL | Status: DC | PRN

## 2024-01-24 MED ORDER — BUPIVACAINE-EPINEPHRINE (PF) 0.25% -1:200000 IJ SOLN
INTRAMUSCULAR | Status: DC | PRN
  Administered 2024-01-24: 10 mL

## 2024-01-24 MED ORDER — LIDOCAINE-EPINEPHRINE 1 %-1:100000 IJ SOLN
INTRAMUSCULAR | Status: AC
  Filled 2024-01-24: qty 1

## 2024-01-24 MED ORDER — BUPIVACAINE-EPINEPHRINE (PF) 0.25% -1:200000 IJ SOLN
INTRAMUSCULAR | Status: AC
  Filled 2024-01-24: qty 30

## 2024-01-24 MED ORDER — ONDANSETRON HCL 4 MG/2ML IJ SOLN: 4.0000 mg | Freq: Once | INTRAMUSCULAR | Status: DC | PRN

## 2024-01-24 SURGICAL SUPPLY — 59 items
APPLICATOR CHLORAPREP 3ML ORNG (MISCELLANEOUS) ×1 IMPLANT
BLADE ARTHRO LOK 4 BEAVER (BLADE) ×1 IMPLANT
BLADE SURG 15 STRL LF DISP TIS (BLADE) ×2 IMPLANT
BNDG COHESIVE 4X5 TAN STRL LF (GAUZE/BANDAGES/DRESSINGS) ×1 IMPLANT
BNDG ELASTIC 3INX 5YD STR LF (GAUZE/BANDAGES/DRESSINGS) IMPLANT
BNDG ELASTIC 4INX 5YD STR LF (GAUZE/BANDAGES/DRESSINGS) ×2 IMPLANT
BNDG ESMARK 4X9 LF (GAUZE/BANDAGES/DRESSINGS) ×1 IMPLANT
BNDG GAUZE DERMACEA FLUFF 4 (GAUZE/BANDAGES/DRESSINGS) ×1 IMPLANT
CHLORAPREP W/TINT 26 (MISCELLANEOUS) ×1 IMPLANT
CORD BIPOLAR FORCEPS 12FT (ELECTRODE) ×1 IMPLANT
COVER BACK TABLE 60X90IN (DRAPES) ×1 IMPLANT
CUFF TOURN SGL QUICK 18X4 (TOURNIQUET CUFF) IMPLANT
DRAPE IMP U-DRAPE 54X76 (DRAPES) ×1 IMPLANT
DRAPE OEC MINIVIEW 54X84 (DRAPES) ×1 IMPLANT
DRAPE SURG 17X23 STRL (DRAPES) ×1 IMPLANT
GAUZE PAD ABD 8X10 STRL (GAUZE/BANDAGES/DRESSINGS) ×1 IMPLANT
GAUZE SPONGE 4X4 12PLY STRL (GAUZE/BANDAGES/DRESSINGS) ×1 IMPLANT
GAUZE STRETCH 2X75IN STRL (MISCELLANEOUS) ×1 IMPLANT
GAUZE XEROFORM 1X8 LF (GAUZE/BANDAGES/DRESSINGS) ×1 IMPLANT
GLOVE BIO SURGEON STRL SZ7.5 (GLOVE) ×2 IMPLANT
GLOVE BIOGEL PI IND STRL 7.5 (GLOVE) ×2 IMPLANT
GOWN STRL REUS W/ TWL LRG LVL3 (GOWN DISPOSABLE) ×2 IMPLANT
GOWN STRL REUS W/ TWL XL LVL3 (GOWN DISPOSABLE) IMPLANT
GOWN STRL SURGICAL XL XLNG (GOWN DISPOSABLE) ×1 IMPLANT
KIT BASIN OR (CUSTOM PROCEDURE TRAY) ×1 IMPLANT
MANIFOLD NEPTUNE II (INSTRUMENTS) ×1 IMPLANT
NDL HYPO 25X1 1.5 SAFETY (NEEDLE) IMPLANT
NDL HYPO 25X5/8 SAFETYGLIDE (NEEDLE) IMPLANT
NDL SUT 6 .5 CRC .975X.05 MAYO (NEEDLE) IMPLANT
NEEDLE HYPO 25X1 1.5 SAFETY (NEEDLE) IMPLANT
NEEDLE HYPO 25X5/8 SAFETYGLIDE (NEEDLE) IMPLANT
NS IRRIG 1000ML POUR BTL (IV SOLUTION) IMPLANT
PACK ORTHO EXTREMITY (CUSTOM PROCEDURE TRAY) ×1 IMPLANT
PAD CAST 3X4 CTTN HI CHSV (CAST SUPPLIES) ×1 IMPLANT
SHEET MEDIUM DRAPE 40X70 STRL (DRAPES) ×1 IMPLANT
SLING ARM FOAM STRAP LRG (SOFTGOODS) IMPLANT
SPIKE FLUID TRANSFER (MISCELLANEOUS) IMPLANT
SPLINT PLASTER CAST XFAST 4X15 (CAST SUPPLIES) ×1 IMPLANT
SPONGE SURGIFOAM ABS GEL 12-7 (HEMOSTASIS) IMPLANT
STOCKINETTE IMPERVIOUS 9X36 MD (GAUZE/BANDAGES/DRESSINGS) ×1 IMPLANT
SUCTION TUBE FRAZIER 10FR DISP (SUCTIONS) IMPLANT
SUT ETHIBOND 0 MO6 C/R (SUTURE) IMPLANT
SUT ETHILON 4 0 CL P 3 (SUTURE) IMPLANT
SUT ETHILON 4 0 PS 2 18 (SUTURE) IMPLANT
SUT MNCRL AB 3-0 PS2 27 (SUTURE) IMPLANT
SUT MNCRL AB 4-0 PS2 18 (SUTURE) IMPLANT
SUT PROLENE 5 0 PC 1 (SUTURE) IMPLANT
SUT PROLENE 5 0 PS 2 (SUTURE) IMPLANT
SUT VIC AB 3-0 PS2 18XBRD (SUTURE) IMPLANT
SUT VIC AB 3-0 SH 27X BRD (SUTURE) IMPLANT
SUTURE 0 FIBERLP 38 BLU TPR ND (SUTURE) IMPLANT
SUTURE FIBERWR 3-0 18 DIAM 3/8 (SUTURE) IMPLANT
SUTURE FIBERWR 4-0 18 TAPR NDL (SUTURE) IMPLANT
SYR BULB EAR ULCER 3OZ GRN STR (SYRINGE) ×2 IMPLANT
SYR CONTROL 10ML LL (SYRINGE) IMPLANT
TAPE SUT LABRALTAP WHT/BLK (SUTURE) IMPLANT
TOWEL GREEN STERILE FF (TOWEL DISPOSABLE) ×2 IMPLANT
TUBE CONNECTING 20X1/4 (TUBING) IMPLANT
UNDERPAD 30X36 HEAVY ABSORB (UNDERPADS AND DIAPERS) ×1 IMPLANT

## 2024-01-24 NOTE — Transfer of Care (Signed)
 Immediate Anesthesia Transfer of Care Note  Patient: Marcus Scott  Procedure(s) Performed: REPAIR, TENDON, EXTENSOR (Right)  Patient Location: PACU  Anesthesia Type:MAC  Level of Consciousness: awake, alert , and oriented  Airway & Oxygen Therapy: Patient Spontanous Breathing  Post-op Assessment: Report given to RN and Post -op Vital signs reviewed and stable  Post vital signs: Reviewed and stable  Last Vitals:  Vitals Value Taken Time  BP 180/99 01/24/24 1620  Temp    Pulse 63 01/24/24 1623  Resp 16 01/24/24 1623  SpO2 95 % 01/24/24 1623  Vitals shown include unfiled device data.  Last Pain:  Vitals:   01/24/24 1320  TempSrc:   PainSc: 0-No pain         Complications: No notable events documented.

## 2024-01-24 NOTE — Anesthesia Procedure Notes (Signed)
 Anesthesia Regional Block: Supraclavicular block   Pre-Anesthetic Checklist: , timeout performed,  Correct Patient, Correct Site, Correct Laterality,  Correct Procedure, Correct Position, site marked,  Risks and benefits discussed,  Surgical consent,  Pre-op evaluation,  At surgeon's request and post-op pain management  Laterality: Right  Prep: chloraprep       Needles:  Injection technique: Single-shot  Needle Type: Echogenic Stimulator Needle     Needle Length: 10cm  Needle Gauge: 21   Needle insertion depth: 8 cm   Additional Needles:   Procedures:,,,, ultrasound used (permanent image in chart),,    Narrative:  Start time: 01/24/2024 1:23 PM End time: 01/24/2024 1:28 PM Injection made incrementally with aspirations every 5 mL.  Performed by: Personally  Anesthesiologist: Tura Gaines, MD  Additional Notes: Timeout performed. Patient sedated. Relevant anatomy ID'd using US . Incremental 2-5ml injection of LA with frequent aspiration. Patient tolerated procedure well.

## 2024-01-24 NOTE — Discharge Instructions (Signed)

## 2024-01-24 NOTE — Interval H&P Note (Signed)
 History and Physical Interval Note:  01/24/2024 2:14 PM  Marcus Scott  has presented today for surgery, with the diagnosis of RIGHT HAND LONF FINGER EXTENSOR TENDON LACERATION.  The various methods of treatment have been discussed with the patient and family. After consideration of risks, benefits and other options for treatment, the patient has consented to  Procedure(s) with comments: REPAIR, TENDON, EXTENSOR (Right) - RIGHT HAND WOUND EXPLORATION WITH EXTENSOR TENDON REPAIR as a surgical intervention.  The patient's history has been reviewed, patient examined, no change in status, stable for surgery.  I have reviewed the patient's chart and labs.  Questions were answered to the patient's satisfaction.     Nakyra Bourn

## 2024-01-24 NOTE — Progress Notes (Signed)
 No pre-op labs needed per Dr. Yvonnie Heritage.  Pia Brew, RN

## 2024-01-24 NOTE — Op Note (Signed)
 NAME: Marcus Scott MEDICAL RECORD NO: [id number] DATE OF BIRTH: [birth year] FACILITY[facility] LOCATION: [facility] OR PHYSICIAN: Merrill Abide, MD   OPERATIVE REPORT   DATE OF PROCEDURE: 01/24/24    PREOPERATIVE DIAGNOSIS: Right hand dorsal laceration from sawblade with concern for extensor tendon injury   POSTOPERATIVE DIAGNOSIS: Right hand dorsal laceration from sawblade, index finger extensor tendon laceration to EIP and EDC, long finger extensor tendon laceration to [facility]   PROCEDURE: Right hand wound exploration with irrigation debridement Right index finger extensor tendon repair extensor indicis proprius, zone 6 Right index finger extensor tendon repair extensor digitorum communis, zone 6 Right long finger extensor tendon repair extensor digitorum communis, zone 5 Right long finger radial sagittal band repair   SURGEON:  Merrill Abide, M.D.   ASSISTANT: Jerie Montana, OPA   ANESTHESIA:  Regional with sedation   INTRAVENOUS FLUIDS:  Per anesthesia flow sheet.   ESTIMATED BLOOD LOSS:  Minimal.   COMPLICATIONS:  None.   SPECIMENS:  none   TOURNIQUET TIME:  52 minutes    DISPOSITION:  Stable to PACU.   INDICATIONS: 63 year old male who sustained a significant laceration to the dorsal aspect of the right hand after a sawblade kick back injury.  Based on his clinical examination which is consistent with his emergency department workup, there is concern for extensor tendon laceration to the right long finger.  We discussed that if the extensor tendon is in discontinuity, this will preclude the ability for appropriate healing and will lead to lack of function with extension of the long finger long-term.  From a treatment standpoint, patient is indicated for right hand wound exploration and likely extensor tendon repair, all other indicated procedures based on exploration.   Risks and benefits of the procedure were discussed, risks including but not limited to infection,  bleeding, scarring, stiffness, nerve injury, tendon injury, vascular injury, extensor lag, lack of mobility or grip strength, recurrence of symptoms and need for subsequent operation.  I did once again discussed that based on expiration, all indicated procedures would be performed.  The major concern was for a long finger extensor tendon laceration, however the injury did span the large area of the dorsal aspect of the hand, meaning that detailed exploration would be necessary.  We also discussed the appropriate postoperative protocol and timeframe for return to activities and function.  Patient expressed understanding.  He elected to proceed with surgery as scheduled.   OPERATIVE COURSE: Patient was seen and identified in the preoperative area and marked appropriately.  Surgical consent had been signed. Preoperative IV antibiotic prophylaxis was given. He was transferred to the operating room and placed in supine position with the Right upper extremity on an arm board.  Sedation was induced by the anesthesiologist. A regional block had been performed by anesthesia in preoperative holding.    Right upper extremity was prepped and draped in normal sterile orthopedic fashion.  A surgical pause was performed between the surgeons, anesthesia, and operating room staff and all were in agreement as to the patient, procedure, and site of procedure.  Tourniquet was placed and padded appropriately to the right upper arm.  The prior sutures from the emergency department setting were removed atraumatically from the dorsal aspect of the hand.  The arm was subsequently exsanguinated and the tourniquet was inflated to 250 mmHg.  Laceration site was bluntly opened for exploration.  Skin flaps were elevated to expose the underlying injury sites.  There was notable disruption to the long finger extensor tendon  and the zone 5 region.  Extensor tendon had been lacerated in transverse fashion just proximal to the MCP region.   Capsule of the MCP of the long finger was noted to be intact.  There was disruption notable to the radial sagittal band of the long finger.  Junctura were noted to be intact from the proximal aspect of the long finger extensor tendon to both the ring and index finger EDC.  However, upon further exploration, we noted disruption of the index finger extensor apparatus as well.  There was discontinuity of the EIP tendon and partial injury EDC tendon of the index finger.    At this point, copious irrigation was performed.  Some scant areas of devitalized tissue was sharply removed.  Extensor tendon stumps were freshened sharply utilizing an 11 blade back to healthy tissue.  We first began with the index finger extensor tendon repairs.  EIP tendon was noted to be in full discontinuity, ulnar to the [facility] of the index finger which was also partially lacerated.  EIP repair was performed utilizing 4-0 FiberWire in modified Kessler fashion, 4 core sutures were achieved.  This was followed by 5-0 Prolene in running locked fashion as an epitendinous suture.  EDC tendon was repaired in the same fashion, using 4-0 FiberWire in modified Kessler fashion followed by 5-0 Prolene in running locked fashion as an epitendinous suture.  Extensor tendon edges were well-approximated, appropriate tension was appreciated of the index finger after repair.  We then turned our attention to the long finger extensor tendon injury.  Once again, complete disruption of the extensor tendon was appreciated, just proximal to the MCP level.  Tendon edges were freshened utilizing an 11 blade back to healthy tissue, tendon edges were able to be reapproximated without undue tension, repair was then performed utilizing 3-0 FiberWire in a modified Kessler fashion achieving 4 core sutures.  This followed by 5-0 Prolene in running locked fashion for an epitendinous suture.  Once again, appropriate tension was noted to the long finger after repair.  Tenodesis  effect was then checked, appropriate extension of the index and long finger was noted with wrist flexion, passive flexion of the digits was able to be achieved to full fist.  Repair was able to tolerate passive flexion of both digits to composite fist.  There was noted to be partial injury to the radial sagittal band of the long finger.  Using 3-0 Vicryl, in figure-of-eight fashion, the sagittal band was able to be repaired along the radial border.  There was no significant tendon subluxation appreciated with tenodesis effect or passive range of motion.  At this juncture, once were satisfied with our repairs, the tourniquet was deflated at 52 minutes.  Fingertips were pink with brisk capillary refill after deflation of tourniquet.  Copious irrigation was once then performed of the wound followed by layered closure utilizing 3-0 Vicryl in simple standard fashion for the subcutaneous layer and 4-0 nylon for the skin surface in horizontal mattress fashion.  Sterile dressings were applied utilizing Xeroform, 4 x 4's, Kerlix and Webril.  This was followed by application of a clamshell splint with the hand and wrist in the intrinsic plus position, digits have full extension.  Ace wrap's were placed over the plaster.  Sling was applied.  The operative drapes were broken down.  The patient was awoken from anesthesia safely and taken to PACU in stable condition.   Post-operative plan: The patient will recover in the post-anesthesia care unit and then be discharged  home.  The patient will be non weight bearing on the right upper extremity in a short arm clamshell splint.   I will see the patient back in the office in  2 weeks  for postoperative followup.    Gaje Tennyson, MD Electronically signed, 01/24/24

## 2024-01-25 ENCOUNTER — Encounter (HOSPITAL_COMMUNITY): Payer: Self-pay | Admitting: Orthopedic Surgery

## 2024-01-25 NOTE — Anesthesia Postprocedure Evaluation (Signed)
 Anesthesia Post Note  Patient: Marcus Scott  Procedure(s) Performed: REPAIR, TENDON, EXTENSOR (Right)     Patient location during evaluation: PACU Anesthesia Type: Regional and MAC Level of consciousness: awake and alert Pain management: pain level controlled Vital Signs Assessment: post-procedure vital signs reviewed and stable Respiratory status: spontaneous breathing, nonlabored ventilation, respiratory function stable and patient connected to nasal cannula oxygen Cardiovascular status: stable and blood pressure returned to baseline Postop Assessment: no apparent nausea or vomiting Anesthetic complications: no  No notable events documented.  Last Vitals:  Vitals:   01/24/24 1645 01/24/24 1656  BP: (!) 183/100   Pulse: 63 63  Resp: 15 20  Temp:  36.7 C  SpO2: 95% 96%    Last Pain:  Vitals:   01/24/24 1620  TempSrc:   PainSc: 0-No pain                 [name] [name]

## 2024-01-31 ENCOUNTER — Encounter: Payer: Self-pay | Admitting: Orthopedic Surgery

## 2024-02-07 ENCOUNTER — Ambulatory Visit: Payer: Self-pay | Admitting: Orthopedic Surgery

## 2024-02-07 DIAGNOSIS — S61401A Unspecified open wound of right hand, initial encounter: Secondary | ICD-10-CM

## 2024-02-07 DIAGNOSIS — S66821A Laceration of other specified muscles, fascia and tendons at wrist and hand level, right hand, initial encounter: Secondary | ICD-10-CM

## 2024-02-07 NOTE — Progress Notes (Signed)
   Marcus Scott - 63 y.o. male MRN [id number]  Date of birth: 11-Jan-1961  Office Visit Note: Visit Date: 02/07/2024 PCP: Patient, No Pcp Per Referred by: No ref. provider found  Subjective:  HPI: Xamir Rohlik is a 63 y.o. male who presents today for follow up 2 weeks status post right hand wound exploration with irrigation debridement, right index finger extensor tendon repair extensor indicis proprius, zone 6; right index finger extensor tendon repair extensor digitorum communis zone 6, right long finger extensor tendon repair extensor digitorum communis, zone 5; right long finger radial sagittal band repair.  Doing well overall this morning, pain is controlled.  Has been compliant with the immobilization as instructed.  Pertinent ROS were reviewed with the patient and found to be negative unless otherwise specified above in HPI.   Assessment & Plan: Visit Diagnoses: No diagnosis found.  Plan: He is doing well overall.  Given the significance of his repair, we will transition to a hard cast today for an additional 2 weeks to protect repair sites, hand will be placed in the intrinsic plus position.  Sutures are not ready to remove just yet, we will have him return in 1 week for wound check and suture removal at that time.  Follow-up: No follow-ups on file.   Meds & Orders: No orders of the defined types were placed in this encounter.  No orders of the defined types were placed in this encounter.    Procedures: No procedures performed       Objective:   Vital Signs: There were no vitals taken for this visit.  Ortho Exam Right hand: - Dorsal incision, sutures in place, skin edges well-approximated without significant erythema or drainage - Appropriate cascade notable to the digits, range of motion was not tested today formally to protect repair - Sensation intact distally median/radial/ulnar distributions - Hand remains warm well-perfused  Imaging: No results  found.   Lachina Salsberry Alvia Jointer, M.D. [facility], Hand Surgery

## 2024-02-12 ENCOUNTER — Encounter: Payer: Self-pay | Admitting: Rehabilitative and Restorative Service Providers"

## 2024-02-12 ENCOUNTER — Ambulatory Visit: Payer: Self-pay | Admitting: Rehabilitative and Restorative Service Providers"

## 2024-02-12 ENCOUNTER — Ambulatory Visit (INDEPENDENT_AMBULATORY_CARE_PROVIDER_SITE_OTHER): Payer: Self-pay | Admitting: Orthopedic Surgery

## 2024-02-12 DIAGNOSIS — S66821A Laceration of other specified muscles, fascia and tendons at wrist and hand level, right hand, initial encounter: Secondary | ICD-10-CM

## 2024-02-12 DIAGNOSIS — M6281 Muscle weakness (generalized): Secondary | ICD-10-CM

## 2024-02-12 DIAGNOSIS — M25641 Stiffness of right hand, not elsewhere classified: Secondary | ICD-10-CM

## 2024-02-12 DIAGNOSIS — R600 Localized edema: Secondary | ICD-10-CM

## 2024-02-12 DIAGNOSIS — S61401A Unspecified open wound of right hand, initial encounter: Secondary | ICD-10-CM

## 2024-02-12 DIAGNOSIS — S61411A Laceration without foreign body of right hand, initial encounter: Secondary | ICD-10-CM

## 2024-02-12 DIAGNOSIS — R278 Other lack of coordination: Secondary | ICD-10-CM

## 2024-02-12 DIAGNOSIS — M79641 Pain in right hand: Secondary | ICD-10-CM

## 2024-02-12 DIAGNOSIS — M25631 Stiffness of right wrist, not elsewhere classified: Secondary | ICD-10-CM

## 2024-02-12 NOTE — Progress Notes (Unsigned)
   Marcus Scott - 63 y.o. male MRN [id number]  Date of birth: 1960-12-28  Office Visit Note: Visit Date: 02/12/2024 PCP: Patient, No Pcp Per Referred by: No ref. provider found  Subjective:  HPI: Marcus Scott is a 63 y.o. male who presents today for follow up 2 weeks status post Right hand wound exploration with irrigation debridement Right index finger extensor tendon repair extensor indicis proprius, zone 6 Right index finger extensor tendon repair extensor digitorum communis, zone 6 Right long finger extensor tendon repair extensor digitorum communis, zone 5 Right long finger radial sagittal band repair.  He is doing well overall, pain is controlled.  Sutures removed today.  Pertinent ROS were reviewed with the patient and found to be negative unless otherwise specified above in HPI.   Assessment & Plan: Visit Diagnoses:  1. Laceration of right hand involving extensor tendon   2. Extensor tendon laceration, hand, open wound, right, initial encounter     Plan: He will be seen by occupational therapy today for transition to an orthosis, volar blocking to prevent finger flexion at this time.  We can do some gentle range of motion at the PIP and DIP regions of the digits, this was discussed in detail with Occupational Therapy today.  He is to remain at all times in the volar blocking splint except for hygienic purposes.  Follow-up in 2 weeks with myself for repeat check, at that juncture we will begin to advance his range of motion protocol.  Follow-up: No follow-ups on file.   Meds & Orders: No orders of the defined types were placed in this encounter.   Orders Placed This Encounter  Procedures   Ambulatory referral to Occupational Therapy     Procedures: No procedures performed       Objective:   Vital Signs: There were no vitals taken for this visit.  Ortho Exam Right hand: - Well-healing dorsal incision, sutures removed today, Steri-Strips applied, no  significant erythema or drainage appreciated, there is some slight maceration of the tissues on the dorsal aspect of the hand - Digital range of motion remains limited secondary to immobilization - AIN/PIN/interosseous intact - Sensation intact median/radial/ulnar distributions - Hand is warm well-perfused  Imaging: No results found.   Allona Gondek Alvia Jointer, M.D. [facility], Hand Surgery

## 2024-02-12 NOTE — Therapy (Signed)
 OUTPATIENT OCCUPATIONAL THERAPY ORTHO EVALUATION  Patient Name: Marcus Scott MRN: [id number] DOB:1961-03-06, 63 y.o., male Today's Date: 02/12/2024  PCP: N/A REFERRING PROVIDER:  Merrill Abide, MD    END OF SESSION:  OT End of Session - 02/12/24 1623     Visit Number 1    Number of Visits 12    Date for OT Re-Evaluation 03/28/24    Authorization Type N/A    OT Start Time 1613    OT Stop Time 1703    OT Time Calculation (min) 50 min    Equipment Utilized During Treatment orthotic materials    Activity Tolerance Patient tolerated treatment well;No increased pain;Patient limited by pain;Patient limited by fatigue    Behavior During Therapy [facility] for tasks assessed/performed             Past Medical History:  Diagnosis Date   Hypertension    STATES BP HAS BEEN HIGH FOR QUIET SOME TIME, NO MEDS, NO PCP   Past Surgical History:  Procedure Laterality Date   PATELLA RECONSTRUCTION Right 2021   at [facility]   REPAIR EXTENSOR TENDON Right 01/24/2024   Procedure: REPAIR, TENDON, EXTENSOR;  Surgeon: Merrill Abide, MD;  Location: [facility] OR;  Service: Orthopedics;  Laterality: Right;  RIGHT HAND WOUND EXPLORATION WITH EXTENSOR TENDON REPAIR   Patient Active Problem List   Diagnosis Date Noted   Laceration of right hand involving extensor tendon 01/24/2024    ONSET DATE: DOS 01/24/24  REFERRING DIAG:  H84.696E,X52.841L (ICD-10-CM) - Laceration of right hand involving extensor tendon  S66.821A,S61.401A (ICD-10-CM) - Extensor tendon laceration, hand, open wound, right, initial encounter    THERAPY DIAG:  Localized edema  Muscle weakness (generalized)  Other lack of coordination  Pain in right hand  Stiffness of right wrist, not elsewhere classified  Stiffness of right hand, not elsewhere classified  Rationale for Evaluation and Treatment: Rehabilitation  SUBJECTIVE:   SUBJECTIVE STATEMENT: Now ~3 weeks post-op Rt EIP zone 6 repair, EDC zone 6 repair to index  finger, and EDC zone 5 repair to middle finger as well as radial sagittal band repair at middle finger. He was building a planter and a saw cut the back of his Rt dom hand.  He states having some lingering numbness but no significant pain right now.  He was not tolerating the cast well, so he came up to get a orthosis made that would allow better ventilation of the wound and also to start the therapy process.     PERTINENT HISTORY: "Right index finger extensor tendon repair extensor indicis proprius, zone 6 Right index finger extensor tendon repair extensor digitorum communis, zone 6 Right long finger extensor tendon repair extensor digitorum communis, zone 5 Right long finger radial sagittal band repair ."  PRECAUTIONS: None  RED FLAGS: None   WEIGHT BEARING RESTRICTIONS: Yes nonweightbearing in the right hand and arm now  PAIN:  Are you having pain? No  FALLS: Has patient fallen in last 6 months? No  LIVING ENVIRONMENT: Lives with: lives with their spouse Lives in: House/apartment Has following equipment at home: None  PLOF: Independent  PATIENT GOALS: To safely improve the use of his right dominant hand and arm  NEXT MD VISIT: 02/27/2024   OBJECTIVE: (All objective assessments below are from initial evaluation on: 02/12/24 unless otherwise specified.)   HAND DOMINANCE: Right   ADLs: Overall ADLs: States decreased ability to grab, hold household objects, pain and difficulty to open containers, perform FMS tasks (manipulate fasteners on clothing), mild to  moderate bathing problems as well.    FUNCTIONAL OUTCOME MEASURES: Eval: Quick DASH TBD% impairment today  (Higher % Score  =  More Impairment)     UPPER EXTREMITY ROM     Shoulder to Wrist AROM Right eval  Wrist flexion 25  Wrist extension 24  (Blank rows = not tested)   Hand AROM Right eval  Full Fist Ability (or Gap to Distal Palmar Crease) Unable, and unsafe today   Thumb Opposition  (Kapandji Scale)  Not  tested and unsafe to test today  Index MCP (0-90) Index and long fingers are generally outstretched in full extension, no flexion is indicated yet.  Details TBD  Index PIP (0-100)   Index DIP (0-70)    Long MCP (0-90) TBD  Long PIP (0-100)    Long DIP (0-70)    (Blank rows = not tested)   HAND FUNCTION: Eval: NT at eval due to recent and still healing injuries. Will be tested when appropriate.  Observed weakness in affected Rt hand.  Grip strength Right: TBD lbs, Left: TBD lbs   COORDINATION: Eval: Observed coordination impairments with affected right hand.  Details will be tested when safe 9 Hole Peg Test Right: TBD sec (TBD sec is WFL)   SENSATION: Eval:  Light touch diminished around sx area and into dorsum of hand and fingers, though he can feel cool first with warm and he can feel pressure and light sensation somewhat.  EDEMA:   Eval:  Mildly swollen in right hand and wrist today, details TBD  COGNITION: Eval: Overall cognitive status: WFL for evaluation today, very pleasant and not anxious  OBSERVATIONS:   Eval: Surgical site covered with a clean dressing, looks clean no apparent signs of infection today no significant redness, typical/reasonable amount of tenderness.  Gentle wrist range of motion was completely nonpainful today   TODAY'S TREATMENT:  Post-evaluation treatment:   For safety/self-care he was given the following education to do no weightbearing, pushing pulling lifting, etc. with the right hand and arm now.  He should not try to exert any significant amount of force or tension through his hand or fingers or he risks of tendon rupture and surgery site failure.  He should keep his hand clean and dry, he can cover it with compressive gauze which was provided with him today to help reduce edema.  He was also given compressive finger stockinette to help with edema in the fingers.  He was told to elevate the hand at night if he could and he has been elevating it.  He  was told to do dressing changes to his wound at least 1 time a day if not twice a day.   Custom orthotic fabrication was indicated due to pt's healing right hand extensor tendon repair and need for safe, functional positioning. OT fabricated custom volar wrist and hand flexion blocking orthosis for pt today to prevent any active or passive flexion at the wrist and fingers and allow the hand to rest outstretched. It fit well with no areas of pressure, pt states a comfortable fit. Pt was educated on the wearing schedule (on at all times except for wound care and exercises-he is not to take it off to shower) to avoid exposing it to sources of heat, to wipe clean as needed (do not wash, use harsh detergents), to call or come in ASAP if it is causing any irritation or is not achieving desired function. It will be checked/adjusted in upcoming sessions, as needed. Pt  states understanding all directions.    Additionally, he was given the following home exercise program to perform 3-4 times a day carefully, slowly, gently without any pain:  "Take brace off 3-4 x day to do the following ~15x without pain: -remove top 2 straps and gently try to lift index and middle fingers up, off of the brace.  Feel a little tension, pause, relax, repeat.  You can do some with just the index, some with just the middle, and some with both GENTLY.   -take off brace completely to slowly, gently move wrist back and forth WITHOUT pain or big amount of tension.   Also do at least 1 or 2 wound/dressing changes in a day.   Don't take off your brace for any other reason."  He demonstrates this back without significant pain, states understanding and leaves in no significant pain   PATIENT EDUCATION: Education details: See tx section above for details  Person educated: Patient Education method: Verbal Instruction, Teach back, Handouts  Education comprehension: States and demonstrates understanding, Additional Education required     HOME EXERCISE PROGRAM: See tx section above for details    GOALS: Goals reviewed with patient? Yes   SHORT TERM GOALS: (STG required if POC>30 days) Target Date: 02/29/2024  Pt will obtain protective, custom orthotic. Goal status: 02/12/2024: Met  2.  Pt will demo/state understanding of initial HEP to improve pain levels and prerequisite motion. Goal status: INITIAL   LONG TERM GOALS: Target Date: 03/28/2024  Pt will improve functional ability by decreased impairment per Quick DASH assessment from TBD to TBD or better, for better quality of life. Goal status: INITIAL-TBD pending initial measures in the next session as time allows  2.  Pt will improve grip strength in right hand from unsafe to test or perform to at least 25 lbs for functional use at home and in IADLs. Goal status: INITIAL  3.  Pt will improve A/ROM in total active motion of right middle finger and right index finger from no safe active motion to at least 200 degrees each, to have functional motion for tasks like reach and grasp.  Goal status: INITIAL  4.  Pt will improve strength in right wrist flexion and extension from unsafe to test to at least 4+/5 MMT to have increased functional ability to carry out selfcare and higher-level homecare tasks with less difficulty. Goal status: INITIAL  5.  Pt will improve coordination skills in right dominant hand and arm, as seen by within functional limit score on nine-hole peg testing to have increased functional ability to carry out fine motor tasks (fasteners, etc.) and more complex, coordinated IADLs (meal prep, sports, etc.).  Goal status: INITIAL    ASSESSMENT:  CLINICAL IMPRESSION: Patient is a 63 y.o. male who was seen today for occupational therapy evaluation for healing right hand extensor tendon injuries with surgical repairs and subsequent swelling, stiffness, weakness, decreased functional ability and knowledge of safety precautions.  He will benefit from  outpatient occupational therapy to decrease symptoms and increase quality of life.   PERFORMANCE DEFICITS: in functional skills including ADLs, IADLs, coordination, dexterity, sensation, edema, ROM, strength, pain, fascial restrictions, flexibility, Fine motor control, Gross motor control, body mechanics, endurance, decreased knowledge of precautions, wound, and UE functional use, cognitive skills including problem solving and safety awareness, and psychosocial skills including coping strategies, environmental adaptation, and habits.   IMPAIRMENTS: are limiting patient from ADLs, IADLs, rest and sleep, work, and leisure.   COMORBIDITIES: may have co-morbidities  that affects occupational performance. Patient will benefit from skilled OT to address above impairments and improve overall function.  MODIFICATION OR ASSISTANCE TO COMPLETE EVALUATION: No modification of tasks or assist necessary to complete an evaluation.  OT OCCUPATIONAL PROFILE AND HISTORY: Detailed assessment: Review of records and additional review of physical, cognitive, psychosocial history related to current functional performance.  CLINICAL DECISION MAKING: Moderate - several treatment options, min-mod task modification necessary  REHAB POTENTIAL: Excellent  EVALUATION COMPLEXITY: Low      PLAN:  OT FREQUENCY: 1-2x/week  OT DURATION: 6 weeks through 03/28/2024 and up to 12 total visits as needed  PLANNED INTERVENTIONS: 97168 OT Re-evaluation, 97535 self care/ADL training, 16109 therapeutic exercise, 97530 therapeutic activity, 97112 neuromuscular re-education, 97140 manual therapy, 97035 ultrasound, 97039 fluidotherapy, 97010 moist heat, 97010 cryotherapy, 97032 electrical stimulation (manual), 97760 Orthotic Initial, 97763 Orthotic/Prosthetic subsequent, scar mobilization, compression bandaging, energy conservation, coping strategies training, patient/family education, and DME and/or AE instructions  RECOMMENDED OTHER  SERVICES: None now  CONSULTED AND AGREED WITH PLAN OF CARE: Patient  PLAN FOR NEXT SESSION:   Check orthosis as needed, check initial exercises and recommendations, upgrade to approximately 4 weeks postop as tolerated   Leartis Proud, OTR/L, CHT 02/12/2024, 5:30 PM

## 2024-02-27 ENCOUNTER — Ambulatory Visit (INDEPENDENT_AMBULATORY_CARE_PROVIDER_SITE_OTHER): Payer: Self-pay | Admitting: Orthopedic Surgery

## 2024-02-27 ENCOUNTER — Ambulatory Visit (INDEPENDENT_AMBULATORY_CARE_PROVIDER_SITE_OTHER): Payer: Self-pay | Admitting: Rehabilitative and Restorative Service Providers"

## 2024-02-27 ENCOUNTER — Encounter: Payer: Self-pay | Admitting: Rehabilitative and Restorative Service Providers"

## 2024-02-27 DIAGNOSIS — M25631 Stiffness of right wrist, not elsewhere classified: Secondary | ICD-10-CM

## 2024-02-27 DIAGNOSIS — M6281 Muscle weakness (generalized): Secondary | ICD-10-CM

## 2024-02-27 DIAGNOSIS — S61401A Unspecified open wound of right hand, initial encounter: Secondary | ICD-10-CM

## 2024-02-27 DIAGNOSIS — R278 Other lack of coordination: Secondary | ICD-10-CM

## 2024-02-27 DIAGNOSIS — S66821A Laceration of other specified muscles, fascia and tendons at wrist and hand level, right hand, initial encounter: Secondary | ICD-10-CM

## 2024-02-27 DIAGNOSIS — R600 Localized edema: Secondary | ICD-10-CM

## 2024-02-27 DIAGNOSIS — M79641 Pain in right hand: Secondary | ICD-10-CM

## 2024-02-27 DIAGNOSIS — M25641 Stiffness of right hand, not elsewhere classified: Secondary | ICD-10-CM

## 2024-02-27 NOTE — Progress Notes (Signed)
   Marcus Scott - 63 y.o. male MRN [id number]  Date of birth: Feb 14, 1961  Office Visit Note: Visit Date: 02/27/2024 PCP: Patient, No Pcp Per Referred by: No ref. provider found  Subjective:  HPI: Marcus Scott is a 63 y.o. male who presents today for follow up 5 weeks status post Right hand wound exploration with irrigation debridement Right index finger extensor tendon repair extensor indicis proprius, zone 6 Right index finger extensor tendon repair extensor digitorum communis, zone 6. Right long finger extensor tendon repair extensor digitorum communis, zone 5.  Right long finger radial sagittal band repair.  He is doing well overall, pain is controlled.  Currently in his fabricated orthosis from occupational therapy.  Pertinent ROS were reviewed with the patient and found to be negative unless otherwise specified above in HPI.   Assessment & Plan: Visit Diagnoses:  1. Extensor tendon laceration, hand, open wound, right, initial encounter      Plan: He continues to do very well overall.  Wound is demonstrating appropriate healing at this time.  Continue with range of motion protocol given his timeline from surgery.  I did emphasize the importance of occupational therapy moving forward in order to regain digital range of motion particular with flexion of these digits.  He has appropriate extension without notable extensor lag today which is excellent.  Continue with range of motion for additional 3 weeks.  I will plan on seeing him back in approximate 3 weeks time for recheck, at that juncture we will likely progress to strengthening.  Follow-up: No follow-ups on file.   Meds & Orders: No orders of the defined types were placed in this encounter.   No orders of the defined types were placed in this encounter.    Procedures: No procedures performed       Objective:   Vital Signs: There were no vitals taken for this visit.  Ortho Exam Right hand: - Well-healed dorsal  incision, no significant erythema or drainage - Digital range of motion remains limited secondary to immobilization, has appropriate extension of the index and long finger without notable lag, flexion of the digits is somewhat limited secondary to prolonged immobilization, able to perform flexion at the MP, PIP and DIP actively today of both the index and long finger, index PIP 0-30, DIP 0-30; long MCP 0-40, PIP 0-30, DIP 0-30 - AIN/PIN/interosseous intact - Sensation intact median/radial/ulnar distributions - Hand is warm well-perfused  Imaging: No results found.   Gaynor Genco Alvia Jointer, M.D. [facility], Hand Surgery

## 2024-02-27 NOTE — Therapy (Signed)
 OUTPATIENT OCCUPATIONAL THERAPY treatment note  Patient Name: Marcus Scott MRN: [id number] DOB:23-May-1961, 63 y.o., male Today's Date: 02/27/2024  PCP: N/A REFERRING PROVIDER:  Merrill Abide, MD    END OF SESSION:  OT End of Session - 02/27/24 1414     Visit Number 2    Number of Visits 12    Date for OT Re-Evaluation 03/28/24    Authorization Type N/A    OT Start Time 1415    OT Stop Time 1443    OT Time Calculation (min) 28 min    Equipment Utilized During Treatment --    Activity Tolerance Patient tolerated treatment well;No increased pain;Patient limited by pain;Patient limited by fatigue    Behavior During Therapy [facility] for tasks assessed/performed              Past Medical History:  Diagnosis Date   Hypertension    STATES BP HAS BEEN HIGH FOR QUIET SOME TIME, NO MEDS, NO PCP   Past Surgical History:  Procedure Laterality Date   PATELLA RECONSTRUCTION Right 2021   at [facility]   REPAIR EXTENSOR TENDON Right 01/24/2024   Procedure: REPAIR, TENDON, EXTENSOR;  Surgeon: Merrill Abide, MD;  Location: [facility] OR;  Service: Orthopedics;  Laterality: Right;  RIGHT HAND WOUND EXPLORATION WITH EXTENSOR TENDON REPAIR   Patient Active Problem List   Diagnosis Date Noted   Laceration of right hand involving extensor tendon 01/24/2024    ONSET DATE: DOS 01/24/24  REFERRING DIAG:  O96.295M,W41.324M (ICD-10-CM) - Laceration of right hand involving extensor tendon  S66.821A,S61.401A (ICD-10-CM) - Extensor tendon laceration, hand, open wound, right, initial encounter    THERAPY DIAG:  Muscle weakness (generalized)  Other lack of coordination  Pain in right hand  Stiffness of right wrist, not elsewhere classified  Stiffness of right hand, not elsewhere classified  Localized edema  Rationale for Evaluation and Treatment: Rehabilitation  PERTINENT HISTORY: "Right index finger extensor tendon repair extensor indicis proprius, zone 6 Right index finger extensor tendon  repair extensor digitorum communis, zone 6 Right long finger extensor tendon repair extensor digitorum communis, zone 5 Right long finger radial sagittal band repair ." He was building a planter and a saw cut the back of his Rt dom hand.  He states having some lingering numbness but no significant pain right now.  He was not tolerating the cast well, so he came up to get a orthosis made that would allow better ventilation of the wound and also to start the therapy process.     PRECAUTIONS: None  RED FLAGS: None   WEIGHT BEARING RESTRICTIONS: Yes nonweightbearing in the right hand and arm now    SUBJECTIVE:   SUBJECTIVE STATEMENT: Now 5 weeks post-op Rt EIP zone 6 repair, EDC zone 6 repair to index finger, and EDC zone 5 repair to middle finger as well as radial sagittal band repair at middle finger. He states he has been removing his orthosis to perform gentle active range of motion exercises as told 2 weeks ago.  He saw the doctor who states he is healing well and needs to get moving.  He states that he continues to not have insurance, and that he is hesitant about scheduling therapy the recommended once or twice a week, and he did withhold therapy for 2 weeks until today.  These things could set him back for his recovery significantly.  He was warned about that    PAIN:  Are you having pain? No     PATIENT GOALS: To  safely improve the use of his right dominant hand and arm  NEXT MD VISIT: 02/27/2024   OBJECTIVE: (All objective assessments below are from initial evaluation on: 02/12/24 unless otherwise specified.)   HAND DOMINANCE: Right   ADLs: Overall ADLs: States decreased ability to grab, hold household objects, pain and difficulty to open containers, perform FMS tasks (manipulate fasteners on clothing), mild to moderate bathing problems as well.    FUNCTIONAL OUTCOME MEASURES: Eval: Quick DASH TBD, PRN% impairment today  (Higher % Score  =  More Impairment)     UPPER  EXTREMITY ROM     Shoulder to Wrist AROM Right eval Rt 02/27/24  Wrist flexion 25 20  Wrist extension 24 53  (Blank rows = not tested)   Hand AROM Right eval Rt 02/27/24  Full Fist Ability (or Gap to Distal Palmar Crease) Unable, and unsafe today    Thumb Opposition  (Kapandji Scale)  Not tested and unsafe to test today 1/10  Index MCP (0-90) Index and long fingers are generally outstretched in full extension, no flexion is indicated yet.  Details TBD (-6) - 35  Index PIP (0-100)  0 - 32  Index DIP (0-70)   0 - 27  Long MCP (0-90) TBD (-6) - 41  Long PIP (0-100)   0 - 27  Long DIP (0-70)   0 - 24  (Blank rows = not tested)   HAND FUNCTION: Eval: NT at eval due to recent and still healing injuries. Will be tested when appropriate.  Observed weakness in affected Rt hand.  Grip strength Right: TBD lbs, Left: TBD lbs   COORDINATION: Eval: Observed coordination impairments with affected right hand.  Details will be tested when safe 9 Hole Peg Test Right: TBD sec (TBD sec is WFL)   SENSATION: Eval:  Light touch diminished around sx area and into dorsum of hand and fingers, though he can feel cool first with warm and he can feel pressure and light sensation somewhat.   OBSERVATIONS:   02/27/24: Scar looks fairly stiff today but wound is very well-healing.  Some noticeable scar adhesion with finger motion.  Fingers are fairly straight with little to no drooping/lag today.      TODAY'S TREATMENT:  02/27/24: He performs active range of motion for review of exercise as well as new and baseline measures of finger motion.  Wrist motion is somewhat improved though flexion got tighter now.  He states orthosis is fitting well and does not need adjustment.   He was given the following home exercises to do 4-6 times a day, without pain, remove his orthosis to perform.  At this point, he is safe to perform light overpressure with active range of motion and short arc and mid arc planes of  motion as tolerated.  Again for his safety, he was recommended to do no painful activities, forceful gripping pinching or pulling, etc.  He does continue to work which could be hazardous, if he tries to use his hand.  We review wound care as well, and also discuss scar mobilizations with nonslip Dycem to be done passively and also with active resistance at the fingers to help break up scar adhesions.  OT did tell him to use caution to not attenuate the extensors by stretching or moving too vigorously, however he should also not hold his hand too stiffly and nervously in extension as he may develop worse scar adhesions and have lingering stiffness.  He states understanding all directions, tolerates all exercises  well today, states he will "try" to schedule at least once a week, though OT does recommend twice a week therapy for the best outcomes.   Exercises - Bend and Pull Back Wrist SLOWLY  - 3-4 x daily - 1-2 sets - 10-15 reps - Touch Thumb to [facility] Finger  - 3-4 x daily - 1-2 sets - 10 reps - Differential tendon Glides in 4 different planes of motion- 4-6 x daily - 10-15 reps Patient Education - Scar Massages with nonslip Dycem material    PATIENT EDUCATION: Education details: See tx section above for details  Person educated: Patient Education method: Verbal Instruction, Teach back, Handouts  Education comprehension: States and demonstrates understanding, Additional Education required    HOME EXERCISE PROGRAM: Access Code: [email or web address] URL: [email or web address] Date: 02/27/2024 Prepared by: Leartis Proud  GOALS: Goals reviewed with patient? Yes   SHORT TERM GOALS: (STG required if POC>30 days) Target Date: 02/29/2024  Pt will obtain protective, custom orthotic. Goal status: 02/12/2024: Met  2.  Pt will demo/state understanding of initial HEP to improve pain levels and prerequisite motion. Goal status: 02/27/2024: Met   LONG TERM GOALS: Target Date:  03/28/2024  Pt will improve functional ability by decreased impairment per Quick DASH assessment from TBD to TBD or better, for better quality of life. Goal status: INITIAL-TBD pending initial measures in the next session as time allows  2.  Pt will improve grip strength in right hand from unsafe to test or perform to at least 25 lbs for functional use at home and in IADLs. Goal status: INITIAL  3.  Pt will improve A/ROM in total active motion of right middle finger and right index finger from no safe active motion to at least 200 degrees each, to have functional motion for tasks like reach and grasp.  Goal status: INITIAL  4.  Pt will improve strength in right wrist flexion and extension from unsafe to test to at least 4+/5 MMT to have increased functional ability to carry out selfcare and higher-level homecare tasks with less difficulty. Goal status: INITIAL  5.  Pt will improve coordination skills in right dominant hand and arm, as seen by within functional limit score on nine-hole peg testing to have increased functional ability to carry out fine motor tasks (fasteners, etc.) and more complex, coordinated IADLs (meal prep, sports, etc.).  Goal status: INITIAL    ASSESSMENT:  CLINICAL IMPRESSION: 02/27/24: He does seem to have a slightly adherent scar, though he is healing well and has no extension lag right now, he is having difficulty flexing his fingers.  He was encouraged to perform frequent light and nonpainful motion.     PLAN:  OT FREQUENCY: 1-2x/week  OT DURATION: 6 weeks through 03/28/2024 and up to 12 total visits as needed  PLANNED INTERVENTIONS: 97168 OT Re-evaluation, 97535 self care/ADL training, 14782 therapeutic exercise, 97530 therapeutic activity, 97112 neuromuscular re-education, 97140 manual therapy, 97035 ultrasound, 97039 fluidotherapy, 97010 moist heat, 97010 cryotherapy, 97032 electrical stimulation (manual), 97760 Orthotic Initial, 97763 Orthotic/Prosthetic  subsequent, scar mobilization, compression bandaging, energy conservation, coping strategies training, patient/family education, and DME and/or AE instructions  CONSULTED AND AGREED WITH PLAN OF CARE: Patient  PLAN FOR NEXT SESSION:   See him back next week or ideally twice a week to help him safely get mobilizing.  Consider discussion of the light functional activity weaning of orthosis during the day for short periods of time as tolerated, add on light active assistive range of motion, consider ultrasound therapy and  other methods of mobilizing the scar and tissues.    Leartis Proud, OTR/L, CHT 02/27/2024, 5:36 PM

## 2024-03-04 NOTE — Therapy (Signed)
 OUTPATIENT OCCUPATIONAL THERAPY treatment note  Patient Name: Marcus Scott MRN: [id number] DOB:06/01/1961, 63 y.o., male Today's Date: 03/05/2024  PCP: N/A REFERRING PROVIDER:  Merrill Abide, MD    END OF SESSION:  OT End of Session - 03/05/24 1603     Visit Number 3    Number of Visits 12    Date for OT Re-Evaluation 03/28/24    Authorization Type N/A    OT Start Time 1603    OT Stop Time 1653    OT Time Calculation (min) 50 min    Activity Tolerance Patient tolerated treatment well;No increased pain;Patient limited by pain;Patient limited by fatigue    Behavior During Therapy [facility] for tasks assessed/performed              Past Medical History:  Diagnosis Date   Hypertension    STATES BP HAS BEEN HIGH FOR QUIET SOME TIME, NO MEDS, NO PCP   Past Surgical History:  Procedure Laterality Date   PATELLA RECONSTRUCTION Right 2021   at [facility]   REPAIR EXTENSOR TENDON Right 01/24/2024   Procedure: REPAIR, TENDON, EXTENSOR;  Surgeon: Merrill Abide, MD;  Location: [facility] OR;  Service: Orthopedics;  Laterality: Right;  RIGHT HAND WOUND EXPLORATION WITH EXTENSOR TENDON REPAIR   Patient Active Problem List   Diagnosis Date Noted   Laceration of right hand involving extensor tendon 01/24/2024    ONSET DATE: DOS 01/24/24  REFERRING DIAG:  N56.213Y,Q65.784O (ICD-10-CM) - Laceration of right hand involving extensor tendon  S66.821A,S61.401A (ICD-10-CM) - Extensor tendon laceration, hand, open wound, right, initial encounter    THERAPY DIAG:  Muscle weakness (generalized)  Other lack of coordination  Pain in right hand  Stiffness of right wrist, not elsewhere classified  Localized edema  Stiffness of right hand, not elsewhere classified  Rationale for Evaluation and Treatment: Rehabilitation  PERTINENT HISTORY: "Right index finger extensor tendon repair extensor indicis proprius, zone 6 Right index finger extensor tendon repair extensor digitorum communis, zone  6 Right long finger extensor tendon repair extensor digitorum communis, zone 5 Right long finger radial sagittal band repair ." He was building a planter and a saw cut the back of his Rt dom hand.  He states having some lingering numbness but no significant pain right now.  He was not tolerating the cast well, so he came up to get a orthosis made that would allow better ventilation of the wound and also to start the therapy process.     PRECAUTIONS: None  RED FLAGS: None   WEIGHT BEARING RESTRICTIONS: Yes nonweightbearing in the right hand and arm now    SUBJECTIVE:   SUBJECTIVE STATEMENT: Now 6 weeks post-op Rt EIP zone 6 repair, EDC zone 6 repair to index finger, and EDC zone 5 repair to middle finger as well as radial sagittal band repair at middle finger. He states noticing better improvement in motion and ability with the right hand now, after starting new exercises in the last session.  He is having no pain or acute swelling, etc.    PAIN:  Are you having pain? No     PATIENT GOALS: To safely improve the use of his right dominant hand and arm  NEXT MD VISIT: 02/27/2024   OBJECTIVE: (All objective assessments below are from initial evaluation on: 02/12/24 unless otherwise specified.)   HAND DOMINANCE: Right   ADLs: Overall ADLs: States decreased ability to grab, hold household objects, pain and difficulty to open containers, perform FMS tasks (manipulate fasteners on clothing), mild to  moderate bathing problems as well.    FUNCTIONAL OUTCOME MEASURES: Eval: Quick DASH TBD, PRN% impairment today  (Higher % Score  =  More Impairment)     UPPER EXTREMITY ROM     Shoulder to Wrist AROM Right eval Rt 02/27/24 Rt 03/05/24  Wrist flexion 25 20 27   Wrist extension 24 53 59  (Blank rows = not tested)   Hand AROM Right eval Rt 02/27/24   Full Fist Ability (or Gap to Distal Palmar Crease) Unable, and unsafe today     Thumb Opposition  (Kapandji Scale)  Not tested and  unsafe to test today 1/10   Index MCP (0-90) Index and long fingers are generally outstretched in full extension, no flexion is indicated yet.  Details TBD (-6) - 35   Index PIP (0-100)  0 - 32   Index DIP (0-70)   0 - 27   Long MCP (0-90) TBD (-6) - 41   Long PIP (0-100)   0 - 27   Long DIP (0-70)   0 - 24   (Blank rows = not tested)   HAND FUNCTION: Eval: NT at eval due to recent and still healing injuries. Will be tested when appropriate.  Observed weakness in affected Rt hand.  Grip strength Right: TBD lbs, Left: TBD lbs   COORDINATION: Eval: Observed coordination impairments with affected right hand.  Details will be tested when safe 9 Hole Peg Test Right: TBD sec (TBD sec is WFL)   SENSATION: Eval:  Light touch diminished around sx area and into dorsum of hand and fingers, though he can feel cool first with warm and he can feel pressure and light sensation somewhat.   OBSERVATIONS:   02/27/24: Scar looks fairly stiff today but wound is very well-healing.  Some noticeable scar adhesion with finger motion.  Fingers are fairly straight with little to no drooping/lag today.    TODAY'S TREATMENT:  03/05/24: OT reviews his home exercise program with him and educates on several new exercises today which are passive stretches at the wrist individually and passive stretches at the fingers individually.  He was educated to not stretch both the wrist and finger simultaneously yet.  OT performs these with him, and he performs them back for understanding.  Afterwards he does active finger extension with maximum tension in a neutral wrist as well as gentle active fist which shows some improvements after stretches.  He was also told that he can manipulate a pen in his right hand for light fine motor skills task when removing his orthosis.  If he is having any soreness, pain, problems or new swelling he should replace his orthosis to rest.  The exercises below were done with him after OT performs  manual therapy scar mobilization using cupping technique as well as review of the Dycem technique.  He was also told he can use medical tape to help get a lift on his skin as well.  Again for safety/self-care we reviewed no gripping pushing or pulling or weightbearing, using caution for risk of rupture or even attenuating his extensor tendon with too much stretching or making a fist.  He should continue to wear his orthosis at night and in the day to rest and prevent attenuation.   Exercises - Bend and Pull Back Wrist SLOWLY  - 3-4 x daily - 1-2 sets - 10-15 reps - Touch Thumb to [facility] Finger  - 3-4 x daily - 1-2 sets - 10 reps - Tendon Glides  -  4-6 x daily - 10-15 reps - Wrist Flexion Stretch  - 4 x daily - 3-5 reps - 15 sec hold - Wrist Prayer Stretch  - 4 x daily - 3-5 reps - 15 sec hold - BACK KNUCKLE STRETCHES   - 4 x daily - 3-5 reps - 15 sec hold - HOOK Stretch  - 4 x daily - 3-5 reps - 15-20 sec hold - Seated Finger Composite Flexion Stretch  - 4 x daily - 3-5 reps - 15 hold - Thumb stretch  - 4 x daily - 3-5 reps - 15-20 sec hold Patient Education - Scar Massage    PATIENT EDUCATION: Education details: See tx section above for details  Person educated: Patient Education method: Verbal Instruction, Teach back, Handouts  Education comprehension: States and demonstrates understanding, Additional Education required    HOME EXERCISE PROGRAM: Access Code: [email or web address] URL: [email or web address] Date: 02/27/2024 Prepared by: Leartis Proud  GOALS: Goals reviewed with patient? Yes   SHORT TERM GOALS: (STG required if POC>30 days) Target Date: 02/29/2024  Pt will obtain protective, custom orthotic. Goal status: 02/12/2024: Met  2.  Pt will demo/state understanding of initial HEP to improve pain levels and prerequisite motion. Goal status: 02/27/2024: Met   LONG TERM GOALS: Target Date: 03/28/2024  Pt will improve functional ability by decreased impairment per  Quick DASH assessment from TBD to TBD or better, for better quality of life. Goal status: INITIAL-TBD pending initial measures in the next session as time allows  2.  Pt will improve grip strength in right hand from unsafe to test or perform to at least 25 lbs for functional use at home and in IADLs. Goal status: INITIAL  3.  Pt will improve A/ROM in total active motion of right middle finger and right index finger from no safe active motion to at least 200 degrees each, to have functional motion for tasks like reach and grasp.  Goal status: INITIAL  4.  Pt will improve strength in right wrist flexion and extension from unsafe to test to at least 4+/5 MMT to have increased functional ability to carry out selfcare and higher-level homecare tasks with less difficulty. Goal status: INITIAL  5.  Pt will improve coordination skills in right dominant hand and arm, as seen by within functional limit score on nine-hole peg testing to have increased functional ability to carry out fine motor tasks (fasteners, etc.) and more complex, coordinated IADLs (meal prep, sports, etc.).  Goal status: INITIAL    ASSESSMENT:  CLINICAL IMPRESSION: 03/05/24: He is making great gains after starting new active range of motion exercises and now tolerating light stretches very well individually at the wrist and then at the fingers.  We will continue and upgrade per protocols   02/27/24: He does seem to have a slightly adherent scar, though he is healing well and has no extension lag right now, he is having difficulty flexing his fingers.  He was encouraged to perform frequent light and nonpainful motion.     PLAN:  OT FREQUENCY: 1-2x/week  OT DURATION: 6 weeks through 03/28/2024 and up to 12 total visits as needed  PLANNED INTERVENTIONS: 97168 OT Re-evaluation, 97535 self care/ADL training, 16109 therapeutic exercise, 97530 therapeutic activity, 97112 neuromuscular re-education, 97140 manual therapy, 97035  ultrasound, 97039 fluidotherapy, 97010 moist heat, 97010 cryotherapy, 97032 electrical stimulation (manual), 97760 Orthotic Initial, [id number] Orthotic/Prosthetic subsequent, scar mobilization, compression bandaging, energy conservation, coping strategies training, patient/family education, and DME and/or AE instructions  CONSULTED AND AGREED WITH PLAN  OF CARE: Patient  PLAN FOR NEXT SESSION:   Add a in active wrist and finger composite flexion as well as composite wrist and finger stretches as well for extrinsic tightness.  Can add dorsal taping if needed, can also add light therapy putty pinching and gripping as tolerated.  Continue with scar mobilizations and other manual therapy as helpful.  Increase weaning time from orthosis to 1 to 3 hours in the day as tolerated for light functional activities, goal is for night use only by 9 weeks postop and totally discharging orthosis by 11 weeks postop.    Leartis Proud, OTR/L, CHT 03/05/2024, 5:00 PM

## 2024-03-05 ENCOUNTER — Ambulatory Visit (INDEPENDENT_AMBULATORY_CARE_PROVIDER_SITE_OTHER): Payer: Self-pay | Admitting: Rehabilitative and Restorative Service Providers"

## 2024-03-05 ENCOUNTER — Encounter: Payer: Self-pay | Admitting: Rehabilitative and Restorative Service Providers"

## 2024-03-05 DIAGNOSIS — M25641 Stiffness of right hand, not elsewhere classified: Secondary | ICD-10-CM

## 2024-03-05 DIAGNOSIS — R600 Localized edema: Secondary | ICD-10-CM

## 2024-03-05 DIAGNOSIS — M25631 Stiffness of right wrist, not elsewhere classified: Secondary | ICD-10-CM

## 2024-03-05 DIAGNOSIS — R278 Other lack of coordination: Secondary | ICD-10-CM

## 2024-03-05 DIAGNOSIS — M79641 Pain in right hand: Secondary | ICD-10-CM

## 2024-03-05 DIAGNOSIS — M6281 Muscle weakness (generalized): Secondary | ICD-10-CM

## 2024-03-10 NOTE — Therapy (Incomplete)
 OUTPATIENT OCCUPATIONAL THERAPY treatment note  Patient Name: Marcus Scott MRN: [id number] DOB:06-23-1961, 63 y.o., male Today's Date: 03/10/2024  PCP: N/A REFERRING PROVIDER:  Merrill Abide, MD    END OF SESSION:     Past Medical History:  Diagnosis Date   Hypertension    STATES BP HAS BEEN HIGH FOR QUIET SOME TIME, NO MEDS, NO PCP   Past Surgical History:  Procedure Laterality Date   PATELLA RECONSTRUCTION Right 2021   at [facility]   REPAIR EXTENSOR TENDON Right 01/24/2024   Procedure: REPAIR, TENDON, EXTENSOR;  Surgeon: Merrill Abide, MD;  Location: [facility] OR;  Service: Orthopedics;  Laterality: Right;  RIGHT HAND WOUND EXPLORATION WITH EXTENSOR TENDON REPAIR   Patient Active Problem List   Diagnosis Date Noted   Laceration of right hand involving extensor tendon 01/24/2024    ONSET DATE: DOS 01/24/24  REFERRING DIAG:  W09.811B,J47.829F (ICD-10-CM) - Laceration of right hand involving extensor tendon  S66.821A,S61.401A (ICD-10-CM) - Extensor tendon laceration, hand, open wound, right, initial encounter    THERAPY DIAG:  No diagnosis found.  Rationale for Evaluation and Treatment: Rehabilitation  PERTINENT HISTORY: "Right index finger extensor tendon repair extensor indicis proprius, zone 6 Right index finger extensor tendon repair extensor digitorum communis, zone 6 Right long finger extensor tendon repair extensor digitorum communis, zone 5 Right long finger radial sagittal band repair ." He was building a planter and a saw cut the back of his Rt dom hand.  He states having some lingering numbness but no significant pain right now.  He was not tolerating the cast well, so he came up to get a orthosis made that would allow better ventilation of the wound and also to start the therapy process.     PRECAUTIONS: None  RED FLAGS: None   WEIGHT BEARING RESTRICTIONS: Yes nonweightbearing in the right hand and arm now    SUBJECTIVE:   SUBJECTIVE  STATEMENT: Now 7 weeks post-op Rt EIP zone 6 repair, EDC zone 6 repair to index finger, and EDC zone 5 repair to middle finger as well as radial sagittal band repair at middle finger. He states noticing better improvement in motion and ability with the right hand now, after starting new exercises in the last session.  He is having no pain or acute swelling, etc.    PAIN:  Are you having pain? No     PATIENT GOALS: To safely improve the use of his right dominant hand and arm  NEXT MD VISIT: 02/27/2024   OBJECTIVE: (All objective assessments below are from initial evaluation on: 02/12/24 unless otherwise specified.)   HAND DOMINANCE: Right   ADLs: Overall ADLs: States decreased ability to grab, hold household objects, pain and difficulty to open containers, perform FMS tasks (manipulate fasteners on clothing), mild to moderate bathing problems as well.    FUNCTIONAL OUTCOME MEASURES: Eval: Quick DASH TBD, PRN% impairment today  (Higher % Score  =  More Impairment)     UPPER EXTREMITY ROM     Shoulder to Wrist AROM Right eval Rt 02/27/24 Rt 03/05/24 Rt 03/11/24  Wrist flexion 25 20 27    Wrist extension 24 53 59   (Blank rows = not tested)   Hand AROM Right eval Rt 02/27/24 Rt 03/11/24  Full Fist Ability (or Gap to Distal Palmar Crease) Unable, and unsafe today     Thumb Opposition  (Kapandji Scale)  Not tested and unsafe to test today 1/10 ***/10  Index MCP (0-90) Index and long fingers are generally outstretched  in full extension, no flexion is indicated yet.  Details TBD (-6) - 35 *** - ***  Index PIP (0-100)  0 - 32 0 - ***  Index DIP (0-70)   0 - 27 0 - ***  Long MCP (0-90) TBD (-6) - 41 *** - ***  Long PIP (0-100)   0 - 27 0 - ***  Long DIP (0-70)   0 - 24 0 - ***  (Blank rows = not tested)   HAND FUNCTION: Eval: NT at eval due to recent and still healing injuries. Will be tested when appropriate.  Observed weakness in affected Rt hand.  Grip strength Right: TBD lbs,  Left: TBD lbs   COORDINATION: Eval: Observed coordination impairments with affected right hand.  Details will be tested when safe 9 Hole Peg Test Right: TBD sec (TBD sec is WFL)   SENSATION: Eval:  Light touch diminished around sx area and into dorsum of hand and fingers, though he can feel cool first with warm and he can feel pressure and light sensation somewhat.   OBSERVATIONS:   02/27/24: Scar looks fairly stiff today but wound is very well-healing.  Some noticeable scar adhesion with finger motion.  Fingers are fairly straight with little to no drooping/lag today.   Rt EIP zone 6 repair, EDC zone 6 repair to index finger, and EDC zone 5 repair to middle finger as well as radial sagittal band repair at middle finger  TODAY'S TREATMENT:  03/11/24: *** Add a in active wrist and finger composite flexion as well as composite wrist and finger stretches as well for extrinsic tightness.  Can add dorsal taping if needed, can also add light therapy putty pinching and gripping as tolerated.  Continue with scar mobilizations and other manual therapy as helpful.  Increase weaning time from orthosis to 1 to 3 hours in the day as tolerated for light functional activities, goal is for night use only by 9 weeks postop and totally discharging orthosis by 11 weeks postop.     03/05/24: OT reviews his home exercise program with him and educates on several new exercises today which are passive stretches at the wrist individually and passive stretches at the fingers individually.  He was educated to not stretch both the wrist and finger simultaneously yet.  OT performs these with him, and he performs them back for understanding.  Afterwards he does active finger extension with maximum tension in a neutral wrist as well as gentle active fist which shows some improvements after stretches.  He was also told that he can manipulate a pen in his right hand for light fine motor skills task when removing his orthosis.  If he  is having any soreness, pain, problems or new swelling he should replace his orthosis to rest.  The exercises below were done with him after OT performs manual therapy scar mobilization using cupping technique as well as review of the Dycem technique.  He was also told he can use medical tape to help get a lift on his skin as well.  Again for safety/self-care we reviewed no gripping pushing or pulling or weightbearing, using caution for risk of rupture or even attenuating his extensor tendon with too much stretching or making a fist.  He should continue to wear his orthosis at night and in the day to rest and prevent attenuation.   Exercises - Bend and Pull Back Wrist SLOWLY  - 3-4 x daily - 1-2 sets - 10-15 reps - Touch Thumb to  EACH Finger  - 3-4 x daily - 1-2 sets - 10 reps - Tendon Glides  - 4-6 x daily - 10-15 reps - Wrist Flexion Stretch  - 4 x daily - 3-5 reps - 15 sec hold - Wrist Prayer Stretch  - 4 x daily - 3-5 reps - 15 sec hold - BACK KNUCKLE STRETCHES   - 4 x daily - 3-5 reps - 15 sec hold - HOOK Stretch  - 4 x daily - 3-5 reps - 15-20 sec hold - Seated Finger Composite Flexion Stretch  - 4 x daily - 3-5 reps - 15 hold - Thumb stretch  - 4 x daily - 3-5 reps - 15-20 sec hold Patient Education - Scar Massage    PATIENT EDUCATION: Education details: See tx section above for details  Person educated: Patient Education method: Verbal Instruction, Teach back, Handouts  Education comprehension: States and demonstrates understanding, Additional Education required    HOME EXERCISE PROGRAM: Access Code: [email or web address] URL: [email or web address] Date: 02/27/2024 Prepared by: Leartis Proud  GOALS: Goals reviewed with patient? Yes   SHORT TERM GOALS: (STG required if POC>30 days) Target Date: 02/29/2024  Pt will obtain protective, custom orthotic. Goal status: 02/12/2024: Met  2.  Pt will demo/state understanding of initial HEP to improve pain levels and  prerequisite motion. Goal status: 02/27/2024: Met   LONG TERM GOALS: Target Date: 03/28/2024  Pt will improve functional ability by decreased impairment per Quick DASH assessment from TBD to TBD or better, for better quality of life. Goal status: INITIAL-TBD pending initial measures in the next session as time allows  2.  Pt will improve grip strength in right hand from unsafe to test or perform to at least 25 lbs for functional use at home and in IADLs. Goal status: INITIAL  3.  Pt will improve A/ROM in total active motion of right middle finger and right index finger from no safe active motion to at least 200 degrees each, to have functional motion for tasks like reach and grasp.  Goal status: INITIAL  4.  Pt will improve strength in right wrist flexion and extension from unsafe to test to at least 4+/5 MMT to have increased functional ability to carry out selfcare and higher-level homecare tasks with less difficulty. Goal status: INITIAL  5.  Pt will improve coordination skills in right dominant hand and arm, as seen by within functional limit score on nine-hole peg testing to have increased functional ability to carry out fine motor tasks (fasteners, etc.) and more complex, coordinated IADLs (meal prep, sports, etc.).  Goal status: INITIAL    ASSESSMENT:  CLINICAL IMPRESSION: 03/11/24: ***  03/05/24: He is making great gains after starting new active range of motion exercises and now tolerating light stretches very well individually at the wrist and then at the fingers.  We will continue and upgrade per protocols   02/27/24: He does seem to have a slightly adherent scar, though he is healing well and has no extension lag right now, he is having difficulty flexing his fingers.  He was encouraged to perform frequent light and nonpainful motion.     PLAN:  OT FREQUENCY: 1-2x/week  OT DURATION: 6 weeks through 03/28/2024 and up to 12 total visits as needed  PLANNED INTERVENTIONS:  97168 OT Re-evaluation, 97535 self care/ADL training, 81191 therapeutic exercise, 97530 therapeutic activity, 97112 neuromuscular re-education, 97140 manual therapy, 97035 ultrasound, 97039 fluidotherapy, 97010 moist heat, 97010 cryotherapy, 97032 electrical stimulation (manual), [id number] Orthotic Initial, [id number] Orthotic/Prosthetic subsequent, scar  mobilization, compression bandaging, energy conservation, coping strategies training, patient/family education, and DME and/or AE instructions  CONSULTED AND AGREED WITH PLAN OF CARE: Patient  PLAN FOR NEXT SESSION:   ***   Leartis Proud, OTR/L, CHT 03/10/2024, 5:25 PM

## 2024-03-11 ENCOUNTER — Encounter: Payer: Self-pay | Admitting: Rehabilitative and Restorative Service Providers"

## 2024-03-11 NOTE — Therapy (Signed)
 OUTPATIENT OCCUPATIONAL THERAPY treatment note  Patient Name: Marcus Scott MRN: [id number] DOB:[birth year], 63 y.o., male Today's Date: 03/12/2024  PCP: N/A REFERRING PROVIDER:  Merrill Abide, MD    END OF SESSION:  OT End of Session - 03/12/24 1435     Visit Number 4    Number of Visits 12    Date for OT Re-Evaluation 03/28/24    Authorization Type N/A    OT Start Time 1435    OT Stop Time 1520    OT Time Calculation (min) 45 min    Activity Tolerance Patient tolerated treatment well;No increased pain;Patient limited by pain;Patient limited by fatigue    Behavior During Therapy [facility] for tasks assessed/performed               Past Medical History:  Diagnosis Date   Hypertension    STATES BP HAS BEEN HIGH FOR QUIET SOME TIME, NO MEDS, NO PCP   Past Surgical History:  Procedure Laterality Date   PATELLA RECONSTRUCTION Right 2021   at [facility]   REPAIR EXTENSOR TENDON Right 01/24/2024   Procedure: REPAIR, TENDON, EXTENSOR;  Surgeon: Merrill Abide, MD;  Location: [facility] OR;  Service: Orthopedics;  Laterality: Right;  RIGHT HAND WOUND EXPLORATION WITH EXTENSOR TENDON REPAIR   Patient Active Problem List   Diagnosis Date Noted   Laceration of right hand involving extensor tendon 01/24/2024    ONSET DATE: DOS 01/24/24  REFERRING DIAG:  M57.846N,G29.528U (ICD-10-CM) - Laceration of right hand involving extensor tendon  S66.821A,S61.401A (ICD-10-CM) - Extensor tendon laceration, hand, open wound, right, initial encounter    THERAPY DIAG:  Muscle weakness (generalized)  Other lack of coordination  Pain in right hand  Stiffness of right wrist, not elsewhere classified  Localized edema  Stiffness of right hand, not elsewhere classified  Rationale for Evaluation and Treatment: Rehabilitation  PERTINENT HISTORY: Right index finger extensor tendon repair extensor indicis proprius, zone 6 Right index finger extensor tendon repair extensor digitorum communis, zone  6 Right long finger extensor tendon repair extensor digitorum communis, zone 5 Right long finger radial sagittal band repair . He was building a planter and a saw cut the back of his Rt dom hand.  He states having some lingering numbness but no significant pain right now.  He was not tolerating the cast well, so he came up to get a orthosis made that would allow better ventilation of the wound and also to start the therapy process.     PRECAUTIONS: None  RED FLAGS: None   WEIGHT BEARING RESTRICTIONS: Yes nonweightbearing in the right hand and arm now    SUBJECTIVE:   SUBJECTIVE STATEMENT: Now 7 weeks post-op Rt EIP zone 6 repair, EDC zone 6 repair to index finger, and EDC zone 5 repair to middle finger as well as radial sagittal band repair at middle finger. He states not having any significant pain but feeling like the middle finger is holding him back from stiffness.    PAIN:  Are you having pain? No     PATIENT GOALS: To safely improve the use of his right dominant hand and arm  NEXT MD VISIT: 02/27/2024   OBJECTIVE: (All objective assessments below are from initial evaluation on: 02/12/24 unless otherwise specified.)   HAND DOMINANCE: Right   ADLs: Overall ADLs: States decreased ability to grab, hold household objects, pain and difficulty to open containers, perform FMS tasks (manipulate fasteners on clothing), mild to moderate bathing problems as well.    FUNCTIONAL OUTCOME MEASURES: Eval:  Quick DASH TBD, PRN% impairment today  (Higher % Score  =  More Impairment)     UPPER EXTREMITY ROM     Shoulder to Wrist AROM Right eval Rt 02/27/24 Rt 03/05/24 Rt 03/12/24  Wrist flexion 25 20 27  40  Wrist extension 24 53 59 53  (Blank rows = not tested)   Hand AROM Right eval Rt 02/27/24 Rt 03/12/24  Full Fist Ability (or Gap to Distal Palmar Crease) Unable, and unsafe today   3.9 cm gap from tip of MF to [facility]  Thumb Opposition  (Kapandji Scale)  Not tested and unsafe  to test today 1/10 9/10  Index MCP (0-90) Index and long fingers are generally outstretched in full extension, no flexion is indicated yet.  Details TBD (-6) - 35 (-34)  - 66  Index PIP (0-100)  0 - 32 0 - 69  Index DIP (0-70)   0 - 27 (-9) - 43  Long MCP (0-90) TBD (-6) - 41 (-22) - 61  Long PIP (0-100)   0 - 27 0 - 73  Long DIP (0-70)   0 - 24 0 - 55  (Blank rows = not tested)   HAND FUNCTION: Eval: NT at eval due to recent and still healing injuries. Will be tested when appropriate.  Observed weakness in affected Rt hand.  Grip strength Right: TBD lbs, Left: TBD lbs   COORDINATION: Eval: Observed coordination impairments with affected right hand.  Details will be tested when safe 9 Hole Peg Test Right: TBD sec (TBD sec is WFL)   SENSATION: Eval:  Light touch diminished around sx area and into dorsum of hand and fingers, though he can feel cool first with warm and he can feel pressure and light sensation somewhat.   OBSERVATIONS:   02/27/24: Scar looks fairly stiff today but wound is very well-healing.  Some noticeable scar adhesion with finger motion.  Fingers are fairly straight with little to no drooping/lag today.   Rt EIP zone 6 repair, EDC zone 6 repair to index finger, and EDC zone 5 repair to middle finger as well as radial sagittal band repair at middle finger  TODAY'S TREATMENT:  03/12/24: He starts with active range of motion for exercise as well as new measures which shows much much better flexion today, but some associated difficulty extending at the MCP joints.  He was cautioned about this and we will manage this by doing extension strengthening now, continuing the night extension brace, and doing finger extension stretches.  OT does upgrade his home exercises to include composite wrist flexion and finger flexion active range of motion as well as passive motion stretches now.  Additionally, he is ready for therapy activities for therapy putty squeezing and pinching as  bolded below.  He tolerates these without pain but was told to stop these if they become sore or painful.  Lastly, OT does educate on dorsal taping technique that he can do passively for 15 to 30 minutes as tolerated with medical tape, to stretch his hand into a better fist.  He trials this for several minutes in the session, states no pain and states understanding.  He can also wean from his orthosis more often now for 1 to 3-hour periods of time as tolerated to do light (less than 5 pound) functional tasks.  He states understanding all directions, states being encouraged and leaves in no pain.   Exercises - Bend and Pull Back Wrist SLOWLY  - 3-4 x daily -  1-2 sets - 10-15 reps - Wrist Flexion Stretch  - 4 x daily - 3-5 reps - 15 sec hold - Wrist Prayer Stretch  - 4 x daily - 3-5 reps - 15 sec hold - BACK KNUCKLE STRETCHES   - 4 x daily - 3-5 reps - 15 sec hold - HOOK Stretch  - 4 x daily - 3-5 reps - 15-20 sec hold - Seated Finger Composite Flexion Stretch  - 4 x daily - 3-5 reps - 15 hold - Hand PROM Finger Extension  - 4 x daily - 3-5 reps - 15 seconds hold - Tendon Glides  - 4-6 x daily - 10-15 reps - Full Fist  - 2-3 x daily - 5 reps - Duck Mouth Strength  - 2-3 x daily - 5 reps - Finger Extension Pizza!   - 2-3 x daily - 5 reps - Thumb Opposition with Putty  - 2-3 x daily - 5 reps Patient Education - Scar Massage    PATIENT EDUCATION: Education details: See tx section above for details  Person educated: Patient Education method: Verbal Instruction, Teach back, Handouts  Education comprehension: States and demonstrates understanding, Additional Education required    HOME EXERCISE PROGRAM: Access Code: [email or web address] URL: [email or web address] Date: 02/27/2024 Prepared by: Leartis Proud  GOALS: Goals reviewed with patient? Yes   SHORT TERM GOALS: (STG required if POC>30 days) Target Date: 02/29/2024  Pt will obtain protective, custom orthotic. Goal  status: 02/12/2024: Met  2.  Pt will demo/state understanding of initial HEP to improve pain levels and prerequisite motion. Goal status: 02/27/2024: Met   LONG TERM GOALS: Target Date: 03/28/2024  Pt will improve functional ability by decreased impairment per Quick DASH assessment from TBD to TBD or better, for better quality of life. Goal status: INITIAL-TBD pending initial measures in the next session as time allows  2.  Pt will improve grip strength in right hand from unsafe to test or perform to at least 25 lbs for functional use at home and in IADLs. Goal status: INITIAL  3.  Pt will improve A/ROM in total active motion of right middle finger and right index finger from no safe active motion to at least 200 degrees each, to have functional motion for tasks like reach and grasp.  Goal status: INITIAL  4.  Pt will improve strength in right wrist flexion and extension from unsafe to test to at least 4+/5 MMT to have increased functional ability to carry out selfcare and higher-level homecare tasks with less difficulty. Goal status: INITIAL  5.  Pt will improve coordination skills in right dominant hand and arm, as seen by within functional limit score on nine-hole peg testing to have increased functional ability to carry out fine motor tasks (fasteners, etc.) and more complex, coordinated IADLs (meal prep, sports, etc.).  Goal status: INITIAL    ASSESSMENT:  CLINICAL IMPRESSION: 03/12/24: He is nearly doubled his range of motion in his hand now, but needs to watch for drooping of the MCP joint.  He is still working with therapy putty gently, and needs to watch not lifting or squeezing anything heavy or painful.  Otherwise he is doing great and should proceed consistently  03/05/24: He is making great gains after starting new active range of motion exercises and now tolerating light stretches very well individually at the wrist and then at the fingers.  We will continue and upgrade per  protocols   02/27/24: He does seem to have a slightly adherent scar, though  he is healing well and has no extension lag right now, he is having difficulty flexing his fingers.  He was encouraged to perform frequent light and nonpainful motion.     PLAN:  OT FREQUENCY: 1-2x/week  OT DURATION: 6 weeks through 03/28/2024 and up to 12 total visits as needed  PLANNED INTERVENTIONS: 97168 OT Re-evaluation, 97535 self care/ADL training, 40981 therapeutic exercise, 97530 therapeutic activity, 97112 neuromuscular re-education, 97140 manual therapy, 97035 ultrasound, 97039 fluidotherapy, 97010 moist heat, 97010 cryotherapy, 97032 electrical stimulation (manual), 97760 Orthotic Initial, 97763 Orthotic/Prosthetic subsequent, scar mobilization, compression bandaging, energy conservation, coping strategies training, patient/family education, and DME and/or AE instructions  CONSULTED AND AGREED WITH PLAN OF CARE: Patient  PLAN FOR NEXT SESSION:   Check on new stretches, check putty activities, increase resistances send continue to mobilize at 8 weeks post-op    Leartis Proud, OTR/L, CHT 03/12/2024, 3:27 PM

## 2024-03-12 ENCOUNTER — Encounter: Payer: Self-pay | Admitting: Rehabilitative and Restorative Service Providers"

## 2024-03-12 ENCOUNTER — Ambulatory Visit (INDEPENDENT_AMBULATORY_CARE_PROVIDER_SITE_OTHER): Payer: Self-pay | Admitting: Rehabilitative and Restorative Service Providers"

## 2024-03-12 DIAGNOSIS — R278 Other lack of coordination: Secondary | ICD-10-CM

## 2024-03-12 DIAGNOSIS — M25631 Stiffness of right wrist, not elsewhere classified: Secondary | ICD-10-CM

## 2024-03-12 DIAGNOSIS — M6281 Muscle weakness (generalized): Secondary | ICD-10-CM

## 2024-03-12 DIAGNOSIS — M79641 Pain in right hand: Secondary | ICD-10-CM

## 2024-03-12 DIAGNOSIS — R600 Localized edema: Secondary | ICD-10-CM

## 2024-03-12 DIAGNOSIS — M25641 Stiffness of right hand, not elsewhere classified: Secondary | ICD-10-CM

## 2024-03-14 NOTE — Therapy (Signed)
 OUTPATIENT OCCUPATIONAL THERAPY treatment and discharge note  Patient Name: Marcus Scott MRN: [id number] DOB:[birth year], 63 y.o., male Today's Date: 03/18/2024  PCP: N/A REFERRING PROVIDER:  Arlinda Buster, MD                 OCCUPATIONAL THERAPY DISCHARGE SUMMARY  Visits from Start of Care: 5  Current functional level related to goals / functional outcomes: 03/18/14: He has now met most of his long-term goals, has no pain, is tolerating strength at the wrist and the hand well, states understanding safety precautions for the next month.  He states he would like to discharge to self-care and management now, and OT is in agreement as he is doing very well and there are few concerns for him achieving his maximum potential as he is already achieved 80 to 90% of his normal motion now in the injured hand.  Education / Equipment: Pt has all needed materials and education. Pt understands how to continue on with self-management. See tx notes for more details.   Patient agrees to discharge due to max benefits received from outpatient occupational therapy / hand therapy at this time.   Melvenia Ada, OTR/L, CHT 03/18/24              END OF SESSION:  OT End of Session - 03/18/24 1437     Visit Number 5    Number of Visits 12    Date for OT Re-Evaluation 03/28/24    Authorization Type N/A    OT Start Time 1437    OT Stop Time 1521    OT Time Calculation (min) 44 min    Equipment Utilized During Treatment red t-band    Activity Tolerance Patient tolerated treatment well;No increased pain;Patient limited by fatigue    Behavior During Therapy [facility] for tasks assessed/performed             Past Medical History:  Diagnosis Date   Hypertension    STATES BP HAS BEEN HIGH FOR QUIET SOME TIME, NO MEDS, NO PCP   Past Surgical History:  Procedure Laterality Date   PATELLA RECONSTRUCTION Right 2021   at [facility]   REPAIR EXTENSOR TENDON Right 01/24/2024    Procedure: REPAIR, TENDON, EXTENSOR;  Surgeon: Arlinda Buster, MD;  Location: [facility] OR;  Service: Orthopedics;  Laterality: Right;  RIGHT HAND WOUND EXPLORATION WITH EXTENSOR TENDON REPAIR   Patient Active Problem List   Diagnosis Date Noted   Laceration of right hand involving extensor tendon 01/24/2024    ONSET DATE: DOS 01/24/24  REFERRING DIAG:  D38.588J,D33.178J (ICD-10-CM) - Laceration of right hand involving extensor tendon  S66.821A,S61.401A (ICD-10-CM) - Extensor tendon laceration, hand, open wound, right, initial encounter    THERAPY DIAG:  Other lack of coordination  Pain in right hand  Stiffness of right wrist, not elsewhere classified  Muscle weakness (generalized)  Stiffness of right hand, not elsewhere classified  Localized edema  Rationale for Evaluation and Treatment: Rehabilitation  PERTINENT HISTORY: Right index finger extensor tendon repair extensor indicis proprius, zone 6 Right index finger extensor tendon repair extensor digitorum communis, zone 6 Right long finger extensor tendon repair extensor digitorum communis, zone 5 Right long finger radial sagittal band repair . He was building a planter and a saw cut the back of his Rt dom hand.  He states having some lingering numbness but no significant pain right now.  He was not tolerating the cast well, so he came up to get a orthosis made that would allow better ventilation  of the wound and also to start the therapy process.     PRECAUTIONS: None  RED FLAGS: None   WEIGHT BEARING RESTRICTIONS: Yes nonweightbearing in the right hand and arm now    SUBJECTIVE:   SUBJECTIVE STATEMENT: Now ~ 8 weeks post-op Rt EIP zone 6 repair, EDC zone 6 repair to index finger, and EDC zone 5 repair to middle finger as well as radial sagittal band repair at middle finger. He states having no pain now in the past week, weaning orthosis, doing more light functional activities and feeling looser after working with  therapy putty.      PAIN:  Are you having pain? No     PATIENT GOALS: To safely improve the use of his right dominant hand and arm  NEXT MD VISIT: 02/27/2024   OBJECTIVE: (All objective assessments below are from initial evaluation on: 02/12/24 unless otherwise specified.)   HAND DOMINANCE: Right   ADLs: Overall ADLs: States some minor issues with heavier tasks, heavier work tasks, opening tight jars, etc.  All tasks are improving somewhat though.   FUNCTIONAL OUTCOME MEASURES: 03/18/24:  Quick DASH 7% impairment today  (Higher % Score  =  More Impairment)     UPPER EXTREMITY ROM     Shoulder to Wrist AROM Right eval Rt 02/27/24 Rt 03/05/24 Rt 03/12/24 Rt 03/18/24  Wrist flexion 25 20 27  40 46 (60 in Lt)   Wrist extension 24 53 59 53 63 (75 in Lt)   (Blank rows = not tested)   Hand AROM Right eval Rt 02/27/24 Rt 03/12/24 Rt 03/18/24  Full Fist Ability (or Gap to Distal Palmar Crease) Unable, and unsafe today   3.9 cm gap from tip of MF to [facility] 3.5cm gap from tip MF to [facility]  (fingers almost touch palm)  Thumb Opposition  (Kapandji Scale)  Not tested and unsafe to test today 1/10 9/10 9/10  Index MCP (0-90) Index and long fingers are generally outstretched in full extension, no flexion is indicated yet.  Details TBD (-6) - 35 (-34)  - 66 (-20) - 71  Index PIP (0-100)  0 - 32 0 - 69 0 - 79  Index DIP (0-70)   0 - 27 (-9) - 43 0 - 50  Long MCP (0-90) TBD (-6) - 41 (-22) - 61 (-20) - 76  Long PIP (0-100)   0 - 27 0 - 73 0 - 87  Long DIP (0-70)   0 - 24 0 - 55 0 - 58  (Blank rows = not tested)   HAND FUNCTION: 03/18/24: Grip Rt: 21# not painful, Lt: 51#    COORDINATION: 03/18/24: 9HPT Rt: 25sec  is WFL   SENSATION: Eval:  Light touch diminished around sx area and into dorsum of hand and fingers, though he can feel cool first with warm and he can feel pressure and light sensation somewhat.   OBSERVATIONS:   02/27/24: Scar looks fairly stiff today but wound is very  well-healing.  Some noticeable scar adhesion with finger motion.  Fingers are fairly straight with little to no drooping/lag today.   Rt EIP zone 6 repair, EDC zone 6 repair to index finger, and EDC zone 5 repair to middle finger as well as radial sagittal band repair at middle finger  TODAY'S TREATMENT:  03/18/24: He starts with active range of motion for exercise as well as new measures which shows excellent improvements since last seen and that he now has approximately 80 to 90% of  his normal motion compared to his uninjured side.   He tolerated therapy putty activities well over the past week, and he was encouraged to continue those progressively for 2-3 more weeks.  He was given specific activities to help with fine motor skills and in hand manipulation as listed below.   In-Hand Manipulation Skills Rotation:  Hold pen, try to "twirl" like a baton, keeping parallel (or flat) with surface of table. Try going BOTH directions 10x  Flip:  Hold pen in writing position,  flip in an arch to "erase" position, then back to "write" position. Do not lift hand off table.  10x  Translation:  Open hand palm up,  put an object in your palm and then use your fingers and thumb to move it to the tips of your fingers, pinched against your thumb. (bigger is easier (fat marker), smaller is harder (penny)) 10x  Shift:  Hold pen like a dart, start "shifting" it forward & backwards from tip to base (like putting a key in a key hole) 10x     His home exercises were reviewed especially nonpainful stretches, therapy putty activities, and he was given new wrist strengthening isometric activities to begin, but that he should also increase these to therapy band strengthening in 1 week.  OT discusses he can begin bicep and tricep as well as back and full arm training starting next week as tolerated lightly.  We also discussed the weaning from the orthosis in 2 more weeks for the day and night.  Ultimately, he should use  caution with tight gripping or heavy activities until 3 months postop.  He understands all directions including when and how to wear his orthosis to help try to eliminate extension lag.  He agrees to discharge today because he is confident he can achieve the rest of his functional ability on his own.  Exercises - Wrist Flexion Stretch  - 4 x daily - 3-5 reps - 15 sec hold - Wrist Prayer Stretch  - 4 x daily - 3-5 reps - 15 sec hold - BACK KNUCKLE STRETCHES   - 4 x daily - 3-5 reps - 15 sec hold - HOOK Stretch  - 4 x daily - 3-5 reps - 15-20 sec hold - Seated Finger Composite Flexion Stretch  - 4 x daily - 3-5 reps - 15 hold - Hand PROM Finger Extension  - 4 x daily - 3-5 reps - 15 seconds hold - Tendon Glides  - 4-6 x daily - 10-15 reps - Full Fist  - 2-3 x daily - 5 reps - Duck Mouth Strength  - 2-3 x daily - 5 reps - Finger Extension Pizza!   - 2-3 x daily - 5 reps - Thumb Opposition with Putty  - 2-3 x daily - 5 reps - Isometric Wrist Extension Pronated  - 2-3 x daily - 4-5 x weekly - 5 reps - 5-10 seconds hold - Seated Isometric Wrist Flexion Supinated with Manual Resistance  - 4-6 x daily - 1 sets - 10-15 reps    PATIENT EDUCATION: Education details: See tx section above for details  Person educated: Patient Education method: Verbal Instruction, Teach back, Handouts  Education comprehension: States and demonstrates understanding   HOME EXERCISE PROGRAM: Access Code: [email or web address] URL: [email or web address] Date: 02/27/2024 Prepared by: Melvenia Ada  GOALS: Goals reviewed with patient? Yes   SHORT TERM GOALS: (STG required if POC>30 days) Target Date: 02/29/2024  Pt will obtain protective, custom orthotic. Goal status: 02/12/2024: Met  2.  Pt will demo/state understanding of initial HEP to improve pain levels and prerequisite motion. Goal status: 02/27/2024: Met   LONG TERM GOALS: Target Date: 03/28/2024  Pt will improve functional ability by decreased  impairment per Quick DASH assessment to <10% or better, for better quality of life. Goal status: 03/18/24: MET <10% impairment now   2.  Pt will improve grip strength in right hand from unsafe to test or perform to at least 25 lbs for functional use at home and in IADLs. Goal status: 03/18/24: now 21# no pain, will d/c to self-care and HEP   3.  Pt will improve A/ROM in total active motion of right middle finger and right index finger from no safe active motion to at least 200 degrees each, to have functional motion for tasks like reach and grasp.  Goal status: 03/18/24:   180* on IF, 201* in MF now - will d/c goal, consider met   4.  Pt will improve strength in right wrist flexion and extension from unsafe to test to at least 4+/5 MMT to have increased functional ability to carry out selfcare and higher-level homecare tasks with less difficulty. Goal status: 03/18/24: now 4/5 or better, will continue to work at home with HEP  5.  Pt will improve coordination skills in right dominant hand and arm, as seen by within functional limit score on nine-hole peg testing to have increased functional ability to carry out fine motor tasks (fasteners, etc.) and more complex, coordinated IADLs (meal prep, sports, etc.).  Goal status: 03/18/24: Goal met    ASSESSMENT:  CLINICAL IMPRESSION: 03/18/14: He has now met most of his long-term goals, has no pain, is tolerating strength at the wrist and the hand well, states understanding safety precautions for the next month.  He states he would like to discharge to self-care and management now, and OT is in agreement as he is doing very well and there are few concerns for him achieving his maximum potential as he is already achieved 80 to 90% of his normal motion now in the injured hand.    PLAN:  OT FREQUENCY: Discharge  OT DURATION: Discharge  PLANNED INTERVENTIONS: 02831 OT Re-evaluation, 97535 self care/ADL training, 02889 therapeutic exercise, 97530  therapeutic activity, 97112 neuromuscular re-education, 97140 manual therapy, 97035 ultrasound, 97039 fluidotherapy, 97010 moist heat, 97010 cryotherapy, 97032 electrical stimulation (manual), 97760 Orthotic Initial, [id number] Orthotic/Prosthetic subsequent, scar mobilization, compression bandaging, energy conservation, coping strategies training, patient/family education, and DME and/or AE instructions  CONSULTED AND AGREED WITH PLAN OF CARE: Patient  PLAN FOR NEXT SESSION:   N/A/discharge  Melvenia Ada, OTR/L, CHT 03/18/2024, 5:02 PM

## 2024-03-18 ENCOUNTER — Ambulatory Visit (INDEPENDENT_AMBULATORY_CARE_PROVIDER_SITE_OTHER): Payer: Self-pay | Admitting: Rehabilitative and Restorative Service Providers"

## 2024-03-18 ENCOUNTER — Encounter: Payer: Self-pay | Admitting: Rehabilitative and Restorative Service Providers"

## 2024-03-18 DIAGNOSIS — M79641 Pain in right hand: Secondary | ICD-10-CM

## 2024-03-18 DIAGNOSIS — M6281 Muscle weakness (generalized): Secondary | ICD-10-CM

## 2024-03-18 DIAGNOSIS — R600 Localized edema: Secondary | ICD-10-CM

## 2024-03-18 DIAGNOSIS — R278 Other lack of coordination: Secondary | ICD-10-CM

## 2024-03-18 DIAGNOSIS — M25641 Stiffness of right hand, not elsewhere classified: Secondary | ICD-10-CM

## 2024-03-18 DIAGNOSIS — M25631 Stiffness of right wrist, not elsewhere classified: Secondary | ICD-10-CM

## 2024-03-19 ENCOUNTER — Ambulatory Visit (INDEPENDENT_AMBULATORY_CARE_PROVIDER_SITE_OTHER): Payer: Self-pay | Admitting: Orthopedic Surgery

## 2024-03-19 DIAGNOSIS — S66821A Laceration of other specified muscles, fascia and tendons at wrist and hand level, right hand, initial encounter: Secondary | ICD-10-CM

## 2024-03-19 DIAGNOSIS — S61401A Unspecified open wound of right hand, initial encounter: Secondary | ICD-10-CM

## 2024-03-19 NOTE — Progress Notes (Signed)
   Marcus Scott - 63 y.o. male MRN [id number]  Date of birth: [birth year]  Office Visit Note: Visit Date: 03/19/2024 PCP: Patient, No Pcp Per Referred by: No ref. provider found  Subjective:  HPI: Marcus Scott is a 63 y.o. male who presents today for follow up 8 weeks status post Right hand wound exploration with irrigation debridement Right index finger extensor tendon repair extensor indicis proprius, zone 6 Right index finger extensor tendon repair extensor digitorum communis, zone 6. Right long finger extensor tendon repair extensor digitorum communis, zone 5.  Right long finger radial sagittal band repair.  He is doing well overall, pain is controlled.  Currently in his fabricated orthosis from occupational therapy.  Pertinent ROS were reviewed with the patient and found to be negative unless otherwise specified above in HPI.   Assessment & Plan: Visit Diagnoses:  1. Extensor tendon laceration, hand, open wound, right, initial encounter       Plan: He continues to do very well overall.  Wound is demonstrating appropriate healing at this time.  He has done very well with occupational therapy and has been transition to home exercise program.  His range of motion is excellent today examination, continue with range of motion exercises with progression of strengthening as tolerated.  Follow-up with myself in 4 weeks.  Follow-up: No follow-ups on file.   Meds & Orders: No orders of the defined types were placed in this encounter.   No orders of the defined types were placed in this encounter.    Procedures: No procedures performed       Objective:   Vital Signs: There were no vitals taken for this visit.  Ortho Exam Right hand: - Well-healed dorsal incision, no significant erythema or drainage - Digital range of motion appropriate extension of the index and long finger without notable lag, able to perform flexion at the MP, PIP and DIP actively today of both the index  and long finger, near composite fist - AIN/PIN/interosseous intact - Sensation intact median/radial/ulnar distributions - Hand is warm well-perfused - Grip strength Jamar 2 right 30, left 60  Imaging: No results found.   Gisel Vipond Alvia Jointer, M.D. [facility], Hand Surgery

## 2024-04-21 ENCOUNTER — Ambulatory Visit (INDEPENDENT_AMBULATORY_CARE_PROVIDER_SITE_OTHER): Payer: Self-pay | Admitting: Orthopedic Surgery

## 2024-04-21 DIAGNOSIS — S61401A Unspecified open wound of right hand, initial encounter: Secondary | ICD-10-CM

## 2024-04-21 DIAGNOSIS — S66821A Laceration of other specified muscles, fascia and tendons at wrist and hand level, right hand, initial encounter: Secondary | ICD-10-CM

## 2024-04-21 NOTE — Progress Notes (Signed)
   Marcus Scott - 63 y.o. male MRN [id number]  Date of birth: 16-Apr-1961  Office Visit Note: Visit Date: 04/21/2024 PCP: Patient, No Pcp Per Referred by: No ref. provider found  Subjective:  HPI: Marcus Scott is a 63 y.o. male who presents today for follow up 12 weeks status post Right hand wound exploration with irrigation debridement Right index finger extensor tendon repair extensor indicis proprius, zone 6 Right index finger extensor tendon repair extensor digitorum communis, zone 6. Right long finger extensor tendon repair extensor digitorum communis, zone 5.  Right long finger radial sagittal band repair.  He is doing very well and is very pleased with his outcome.  Has resumed all activities as tolerated without any motion restrictions.  Pertinent ROS were reviewed with the patient and found to be negative unless otherwise specified above in HPI.   Assessment & Plan: Visit Diagnoses:  1. Extensor tendon laceration, hand, open wound, right, initial encounter     Plan: He has done very well postoperatively and has resumed all range of motion without restriction.  He is very pleased with his outcome and is completed all of his occupational therapy as prescribed.  At this juncture, he can continue with activity as tolerated without restriction.  I recommended that he return to me in approximate 3 months in order to ensure adequate progression of his strength and maintenance of range of motion.  Follow-up: No follow-ups on file.   Meds & Orders: No orders of the defined types were placed in this encounter.  No orders of the defined types were placed in this encounter.    Procedures: No procedures performed       Objective:   Vital Signs: There were no vitals taken for this visit.  Ortho Exam Right hand: - Well-healed dorsal incision, no significant erythema or drainage - Digital range of motion appropriate extension of the index and long finger without notable lag,  able to perform flexion at the MP, PIP and DIP actively today of both the index and long finger, able to perform composite fist - AIN/PIN/interosseous intact - Sensation intact median/radial/ulnar distributions - Hand is warm well-perfused - Grip strength Jamar 2 right 50, left 80  Imaging: No results found.   Wendall Isabell Afton Alderton, M.D. [facility], Hand Surgery

## 2024-07-19 ENCOUNTER — Emergency Department (HOSPITAL_BASED_OUTPATIENT_CLINIC_OR_DEPARTMENT_OTHER): Payer: Self-pay

## 2024-07-19 ENCOUNTER — Inpatient Hospital Stay (HOSPITAL_BASED_OUTPATIENT_CLINIC_OR_DEPARTMENT_OTHER)
Admission: EM | Admit: 2024-07-19 | Discharge: 2024-07-21 | DRG: 270 | Disposition: A | Discharge status: Home / Self Care | Payer: Self-pay | Attending: Student | Admitting: Student

## 2024-07-19 ENCOUNTER — Inpatient Hospital Stay (HOSPITAL_COMMUNITY): Payer: Self-pay | Admitting: Anesthesiology

## 2024-07-19 ENCOUNTER — Emergency Department (HOSPITAL_COMMUNITY): Payer: Self-pay

## 2024-07-19 ENCOUNTER — Encounter (HOSPITAL_COMMUNITY): Payer: Self-pay

## 2024-07-19 ENCOUNTER — Encounter (HOSPITAL_COMMUNITY)
Admission: EM | Disposition: A | Discharge status: Home / Self Care | Payer: Self-pay | Source: Home / Self Care | Attending: Student

## 2024-07-19 DIAGNOSIS — D735 Infarction of spleen: Secondary | ICD-10-CM | POA: Diagnosis present

## 2024-07-19 DIAGNOSIS — I772 Rupture of artery: Secondary | ICD-10-CM | POA: Diagnosis present

## 2024-07-19 DIAGNOSIS — I4891 Unspecified atrial fibrillation: Secondary | ICD-10-CM | POA: Diagnosis present

## 2024-07-19 DIAGNOSIS — F419 Anxiety disorder, unspecified: Secondary | ICD-10-CM | POA: Diagnosis present

## 2024-07-19 DIAGNOSIS — Z6841 Body Mass Index (BMI) 40.0 and over, adult: Secondary | ICD-10-CM

## 2024-07-19 DIAGNOSIS — Z79899 Other long term (current) drug therapy: Secondary | ICD-10-CM

## 2024-07-19 DIAGNOSIS — I729 Aneurysm of unspecified site: Secondary | ICD-10-CM | POA: Diagnosis present

## 2024-07-19 DIAGNOSIS — I728 Aneurysm of other specified arteries: Principal | ICD-10-CM | POA: Diagnosis present

## 2024-07-19 DIAGNOSIS — I10 Essential (primary) hypertension: Secondary | ICD-10-CM | POA: Diagnosis present

## 2024-07-19 DIAGNOSIS — D649 Anemia, unspecified: Secondary | ICD-10-CM | POA: Diagnosis present

## 2024-07-19 DIAGNOSIS — K661 Hemoperitoneum: Principal | ICD-10-CM | POA: Diagnosis present

## 2024-07-19 HISTORY — PX: IR ANGIOGRAM VISCERAL SELECTIVE: IMG657

## 2024-07-19 HISTORY — PX: IR US GUIDE VASC ACCESS RIGHT: IMG2390

## 2024-07-19 HISTORY — PX: RADIOLOGY WITH ANESTHESIA: SHX6223

## 2024-07-19 HISTORY — PX: IR EMBO ART  VEN HEMORR LYMPH EXTRAV  INC GUIDE ROADMAPPING: IMG5450

## 2024-07-19 HISTORY — PX: IR ANGIOGRAM SELECTIVE EACH ADDITIONAL VESSEL: IMG667

## 2024-07-19 LAB — COMPREHENSIVE METABOLIC PANEL WITH GFR
ALT: 15 U/L (ref 0–44)
AST: 17 U/L (ref 15–41)
Albumin: 4.3 g/dL (ref 3.5–5.0)
Alkaline Phosphatase: 48 U/L (ref 38–126)
Anion gap: 14 (ref 5–15)
BUN: 16 mg/dL (ref 6–20)
CO2: 23 mmol/L (ref 22–32)
Calcium: 9.8 mg/dL (ref 8.9–10.3)
Chloride: 98 mmol/L (ref 98–111)
Creatinine, Ser: 1.04 mg/dL (ref 0.61–1.24)
GFR, Estimated: >60 mL/min (ref >60)
Glucose, Bld: 123 mg/dL — ABNORMAL HIGH (ref 70–99)
Potassium: 3.6 mmol/L (ref 3.5–5.1)
Sodium: 135 mmol/L (ref 135–145)
Total Bilirubin: 1.3 mg/dL — ABNORMAL HIGH (ref 0.0–1.2)
Total Protein: 7.1 g/dL (ref 6.5–8.1)

## 2024-07-19 LAB — LIPASE, BLOOD: Lipase: 21 U/L (ref 11–51)

## 2024-07-19 LAB — URINALYSIS, W/ REFLEX TO CULTURE (INFECTION SUSPECTED)
Bacteria, UA: NONE SEEN
Bilirubin Urine: NEGATIVE
Glucose, UA: NEGATIVE mg/dL
Hgb urine dipstick: NEGATIVE
Ketones, ur: NEGATIVE mg/dL
Leukocytes,Ua: NEGATIVE
Nitrite: NEGATIVE
Protein, ur: NEGATIVE mg/dL
Specific Gravity, Urine: 1.035 — ABNORMAL HIGH (ref 1.005–1.030)
pH: 6.5 (ref 5.0–8.0)

## 2024-07-19 LAB — PRO BRAIN NATRIURETIC PEPTIDE: Pro Brain Natriuretic Peptide: 832 pg/mL — ABNORMAL HIGH (ref <300.0)

## 2024-07-19 LAB — CBC WITH DIFFERENTIAL/PLATELET
Abs Immature Granulocytes: 0.03 K/uL (ref 0.00–0.07)
Basophils Absolute: 0 K/uL (ref 0.0–0.1)
Basophils Relative: 0 %
Eosinophils Absolute: 0.1 K/uL (ref 0.0–0.5)
Eosinophils Relative: 0 %
HCT: 32.2 % — ABNORMAL LOW (ref 39.0–52.0)
Hemoglobin: 10.9 g/dL — ABNORMAL LOW (ref 13.0–17.0)
Immature Granulocytes: 0 %
Lymphocytes Relative: 15 %
Lymphs Abs: 2 K/uL (ref 0.7–4.0)
MCH: 28.7 pg (ref 26.0–34.0)
MCHC: 33.9 g/dL (ref 30.0–36.0)
MCV: 84.7 fL (ref 80.0–100.0)
Monocytes Absolute: 1 K/uL (ref 0.1–1.0)
Monocytes Relative: 8 %
Neutro Abs: 10.2 K/uL — ABNORMAL HIGH (ref 1.7–7.7)
Neutrophils Relative %: 77 %
Platelets: 326 K/uL (ref 150–400)
RBC: 3.8 MIL/uL — ABNORMAL LOW (ref 4.22–5.81)
RDW: 13.9 % (ref 11.5–15.5)
WBC: 13.5 K/uL — ABNORMAL HIGH (ref 4.0–10.5)
nRBC: 0 % (ref 0.0–0.2)

## 2024-07-19 LAB — TROPONIN T, HIGH SENSITIVITY: Troponin T High Sensitivity: 30 ng/L — ABNORMAL HIGH (ref 0–19)

## 2024-07-19 SURGERY — LAPAROTOMY, EXPLORATORY
Anesthesia: General

## 2024-07-19 SURGERY — RADIOLOGY WITH ANESTHESIA
Anesthesia: General

## 2024-07-19 MED ORDER — SODIUM CHLORIDE 0.9% IV SOLUTION: Freq: Once | INTRAVENOUS | Status: AC

## 2024-07-19 MED ORDER — PROCHLORPERAZINE EDISYLATE 10 MG/2ML IJ SOLN: 5.0000 mg | Freq: Four times a day (QID) | INTRAMUSCULAR | Status: DC | PRN

## 2024-07-19 MED ORDER — MIDAZOLAM HCL 2 MG/2ML IJ SOLN
INTRAMUSCULAR | Status: AC
  Filled 2024-07-19: qty 2

## 2024-07-19 MED ORDER — FENTANYL CITRATE (PF) 50 MCG/ML IJ SOSY
50.0000 ug | PREFILLED_SYRINGE | Freq: Once | INTRAMUSCULAR | Status: AC
  Administered 2024-07-19: 50 ug via INTRAVENOUS
  Filled 2024-07-19: qty 1

## 2024-07-19 MED ORDER — OXYCODONE HCL 5 MG PO TABS: 5.0000 mg | ORAL_TABLET | ORAL | Status: DC | PRN

## 2024-07-19 MED ORDER — KETOROLAC TROMETHAMINE 30 MG/ML IJ SOLN
INTRAMUSCULAR | Status: AC
  Filled 2024-07-19: qty 1

## 2024-07-19 MED ORDER — IOHEXOL 350 MG/ML SOLN
100.0000 mL | Freq: Once | INTRAVENOUS | Status: AC | PRN
  Administered 2024-07-19: 100 mL via INTRAVENOUS

## 2024-07-19 MED ORDER — IOHEXOL 300 MG/ML  SOLN: 100.0000 mL | Freq: Once | INTRAMUSCULAR | Status: DC | PRN

## 2024-07-19 MED ORDER — HYDROMORPHONE HCL 1 MG/ML IJ SOLN: 0.5000 mg | INTRAMUSCULAR | Status: DC | PRN

## 2024-07-19 MED ORDER — ONDANSETRON HCL 4 MG/2ML IJ SOLN
4.0000 mg | Freq: Once | INTRAMUSCULAR | Status: AC
  Administered 2024-07-19: 4 mg via INTRAVENOUS
  Filled 2024-07-19: qty 2

## 2024-07-19 MED ORDER — KETOROLAC TROMETHAMINE 30 MG/ML IJ SOLN
INTRAMUSCULAR | Status: AC | PRN
  Administered 2024-07-19: 30 mg via INTRAMUSCULAR

## 2024-07-19 MED ORDER — IOHEXOL 300 MG/ML  SOLN
150.0000 mL | Freq: Once | INTRAMUSCULAR | Status: AC | PRN
  Administered 2024-07-19: 80 mL via INTRA_ARTERIAL

## 2024-07-19 MED ORDER — FENTANYL CITRATE (PF) 100 MCG/2ML IJ SOLN
INTRAMUSCULAR | Status: AC
  Filled 2024-07-19: qty 2

## 2024-07-19 MED ORDER — SODIUM CHLORIDE 0.9 % IV BOLUS
1000.0000 mL | Freq: Once | INTRAVENOUS | Status: AC
  Administered 2024-07-19: 1000 mL via INTRAVENOUS

## 2024-07-19 MED ORDER — ACETAMINOPHEN 650 MG RE SUPP: 650.0000 mg | Freq: Four times a day (QID) | RECTAL | Status: DC | PRN

## 2024-07-19 MED ORDER — SENNOSIDES-DOCUSATE SODIUM 8.6-50 MG PO TABS: 1.0000 | ORAL_TABLET | Freq: Every evening | ORAL | Status: DC | PRN

## 2024-07-19 MED ORDER — FENTANYL CITRATE (PF) 100 MCG/2ML IJ SOLN
INTRAMUSCULAR | Status: AC | PRN
  Administered 2024-07-19 (×3): 50 ug via INTRAVENOUS
  Administered 2024-07-19 (×2): 25 ug via INTRAVENOUS

## 2024-07-19 MED ORDER — BISACODYL 5 MG PO TBEC: 5.0000 mg | DELAYED_RELEASE_TABLET | Freq: Every day | ORAL | Status: DC | PRN

## 2024-07-19 MED ORDER — METOPROLOL TARTRATE 25 MG PO TABS
25.0000 mg | ORAL_TABLET | Freq: Two times a day (BID) | ORAL | Status: DC
  Administered 2024-07-19 – 2024-07-20 (×2): 25 mg via ORAL
  Filled 2024-07-19 (×2): qty 1

## 2024-07-19 MED ORDER — ACETAMINOPHEN 325 MG PO TABS: 650.0000 mg | ORAL_TABLET | Freq: Four times a day (QID) | ORAL | Status: DC | PRN

## 2024-07-19 MED ORDER — METHOCARBAMOL 1000 MG/10ML IJ SOLN: 500.0000 mg | Freq: Four times a day (QID) | INTRAMUSCULAR | Status: DC | PRN

## 2024-07-19 MED ORDER — SODIUM CHLORIDE 0.9% FLUSH
3.0000 mL | Freq: Two times a day (BID) | INTRAVENOUS | Status: DC
  Administered 2024-07-20 – 2024-07-21 (×3): 3 mL via INTRAVENOUS

## 2024-07-19 MED ORDER — LIDOCAINE HCL 1 % IJ SOLN
INTRAMUSCULAR | Status: AC
  Filled 2024-07-19: qty 20

## 2024-07-19 MED ORDER — METOPROLOL TARTRATE 5 MG/5ML IV SOLN
INTRAVENOUS | Status: AC
  Filled 2024-07-19: qty 5

## 2024-07-19 MED ORDER — MIDAZOLAM HCL (PF) 2 MG/2ML IJ SOLN
INTRAMUSCULAR | Status: AC | PRN
  Administered 2024-07-19: .5 mg via INTRAVENOUS
  Administered 2024-07-19: 2 mg via INTRAVENOUS
  Administered 2024-07-19: 1 mg via INTRAVENOUS
  Administered 2024-07-19: .5 mg via INTRAVENOUS

## 2024-07-19 MED ORDER — METOPROLOL TARTRATE 5 MG/5ML IV SOLN
INTRAVENOUS | Status: AC | PRN
  Administered 2024-07-19 (×2): 5 mg via INTRAVENOUS

## 2024-07-19 MED ADMIN — Lidocaine Inj 1% w/ Epinephrine-1:100000: 20 mL | INTRADERMAL | NDC 63323048257

## 2024-07-19 MED FILL — Metoprolol Tartrate IV Soln 5 MG/5ML: INTRAVENOUS | Qty: 5 | Status: AC

## 2024-07-19 NOTE — ED Provider Notes (Signed)
 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  On exam here patient is awake and alert.  Reports he is feeling okay at this time but does have heart rates between 150-170.  He has 2 units of blood hanging that he has not gotten much of yet.  Patient is going to IR for intervention.  Rhythm strip here shows possible atrial flutter but no prior history of similar could be compensatory at this time.  Blood pressure remained stable.  Patient does not take anticoagulation.  Labs with a hemoglobin of 10 and mild leukocytosis of 13 but otherwise normal.  Will discuss with critical care they will go to evaluate the patient.   Doretha Folks, MD 07/19/24 [phone]

## 2024-07-19 NOTE — Progress Notes (Signed)
 Asked to evaluate patient for ICU level of care. Discussed with IR staff. Patient is currently sedated and in procedure. Had some a. Fib with RVR which now seems controlled with metoprolol. Currently undergoing splenic artery embolization. At this time patient does not appear to require ICU level of care but please reach out to us  following procedure if there is a change in condition.   Verdon Gore, MD Pulmonary and Critical Care Medicine [facility] 07/19/2024 5:37 PM Pager: see AMION  If no response to pager, please call critical care on call (see AMION) until 7pm After 7:00 pm call Elink

## 2024-07-19 NOTE — ED Notes (Signed)
 Second IV was est with good blood return and flushed easily. Blood would not pull enough to be able to fill either one of the blood culture bottles. Therefor second set of cultures were not obtained.

## 2024-07-19 NOTE — H&P (Signed)
 History and Physical    Marcus Scott FMW:[id number] DOB: June 02, 1961 DOA: 07/19/2024  PCP: Patient, No Pcp Per   Patient coming from: Home   Chief Complaint: Abdominal pain   HPI: Marcus Scott is a 63 y.o. male with medical history significant for hypertension who presents with abdominal pain.  Patient reports developing acute onset severe abdominal pain with diaphoresis on 07/17/2024.  He has not been eating due to his symptoms.  He denies any chest pain, palpitations, or known history of heart disease.  [facility] ED Course: Upon arrival to the ED, patient is found to be afebrile, hypertensive, and in rapid atrial fibrillation with rate up to the 150s.  Labs include normal creatinine, WBC 13,500, hemoglobin 10.8, troponin 30, and BNP 832.  EKG demonstrates atrial fibrillation with rate 150 and LAD.  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  He was also treated with IV Lopressor pushes for rapid atrial fibrillation.  Review of Systems:  All other systems reviewed and apart from HPI, are negative.  Past Medical History:  Diagnosis Date   Hypertension    STATES BP HAS BEEN HIGH FOR QUIET SOME TIME, NO MEDS, NO PCP    Past Surgical History:  Procedure Laterality Date   PATELLA RECONSTRUCTION Right 2021   at [facility]   REPAIR EXTENSOR TENDON Right 01/24/2024   Procedure: REPAIR, TENDON, EXTENSOR;  Surgeon: Arlinda Buster, MD;  Location: [facility] OR;  Service: Orthopedics;  Laterality: Right;  RIGHT HAND WOUND EXPLORATION WITH EXTENSOR TENDON REPAIR    Social History:   reports that he has never smoked. He has never used smokeless tobacco. He reports that he does not currently use alcohol. He reports that he does not use drugs.  No Known  Allergies  History reviewed. No pertinent family history.   Prior to Admission medications   Medication Sig Start Date End Date Taking? Authorizing Provider  ascorbic acid (VITAMIN C) 500 MG tablet Take 500 mg by mouth daily.    [provider]  cephALEXin  (KEFLEX ) 500 MG capsule Take 1 capsule (500 mg total) by mouth 3 (three) times daily. 01/19/24   Patt Alm Macho, MD  oxyCODONE  (ROXICODONE ) 5 MG immediate release tablet Take 1 tablet (5 mg total) by mouth every 6 (six) hours as needed for severe pain (pain score 7-10). 01/24/24   Agarwala, Anshul, MD  oxyCODONE -acetaminophen  (PERCOCET) 5-325 MG tablet Take 1 tablet by mouth every 6 (six) hours as needed. 01/19/24   Patt Alm Macho, MD  Vitamin D, Cholecalciferol, 25 MCG (1000 UT) CAPS Take by mouth.    [provider]  zinc gluconate 50 MG tablet Take 50 mg by mouth daily.    [provider]    Physical Exam: Vitals:   07/19/24 1930 07/19/24 1945 07/19/24 2000 07/19/24 2020  BP: (!) 134/97 (!) 141/92 (!) 136/95 (!) 146/95  Pulse: (!) 104 100 (!) 105 (!) 121  Resp: 18 20 20 20   Temp:   98.2 F (36.8 C) 99.2 F (37.3 C)  TempSrc:    Oral  SpO2: 94% 95% 95% 97%     Constitutional: NAD, no pallor or diaphoresis   Eyes: PERTLA, lids and conjunctivae normal ENMT: Mucous membranes are moist. Posterior pharynx clear of any exudate or lesions.  Neck: supple, no masses  Respiratory: no wheezing, no crackles. No accessory muscle use.  Cardiovascular: S1 & S2 heard, regular rate and rhythm. No JVD. Abdomen: Soft, no guarding. Bowel sounds active.  Musculoskeletal: no clubbing / cyanosis. No joint deformity upper and lower extremities.   Skin: no significant rashes, lesions, ulcers. Warm, dry, well-perfused. Neurologic: CN 2-12 grossly intact. Moving all extremities. Alert and oriented.  Psychiatric: Calm. Cooperative.    Labs and Imaging on Admission: I have personally reviewed following labs and  imaging studies  CBC: Recent Labs  Lab 07/19/24 1311  WBC 13.5*  NEUTROABS 10.2*  HGB 10.9*  HCT 32.2*  MCV 84.7  PLT 326   Basic Metabolic Panel: Recent Labs  Lab 07/19/24 1311  NA 135  K 3.6  CL 98  CO2 23  GLUCOSE 123*  BUN 16  CREATININE 1.04  CALCIUM 9.8   GFR: CrCl cannot be calculated (Unknown ideal weight.). Liver Function Tests: Recent Labs  Lab 07/19/24 1311  AST 17  ALT 15  ALKPHOS 48  BILITOT 1.3*  PROT 7.1  ALBUMIN 4.3   Recent Labs  Lab 07/19/24 1311  LIPASE 21   No results for input(s): AMMONIA in the last 168 hours. Coagulation Profile: No results for input(s): INR, PROTIME in the last 168 hours. Cardiac Enzymes: No results for input(s): CKTOTAL, CKMB, CKMBINDEX, TROPONINI in the last 168 hours. BNP (last 3 results) Recent Labs    07/19/24 1311  PROBNP 832.0*   HbA1C: No results for input(s): HGBA1C in the last 72 hours. CBG: No results for input(s): GLUCAP in the last 168 hours. Lipid Profile: No results for input(s): CHOL, HDL, LDLCALC, TRIG, CHOLHDL, LDLDIRECT in the last 72 hours. Thyroid Function Tests: No results for input(s): TSH, T4TOTAL, FREET4, T3FREE, THYROIDAB in the last 72 hours. Anemia Panel: No results for input(s): VITAMINB12, FOLATE, FERRITIN, TIBC, IRON, RETICCTPCT in the last 72 hours. Urine analysis:    Component Value Date/Time   COLORURINE YELLOW 07/19/2024 1340   APPEARANCEUR CLEAR 07/19/2024 1340   LABSPEC 1.035 (H) 07/19/2024 1340   PHURINE 6.5 07/19/2024 1340   GLUCOSEU NEGATIVE 07/19/2024 1340   HGBUR NEGATIVE 07/19/2024 1340   BILIRUBINUR NEGATIVE 07/19/2024 1340   KETONESUR NEGATIVE 07/19/2024 1340   PROTEINUR NEGATIVE 07/19/2024 1340   NITRITE NEGATIVE 07/19/2024 1340   LEUKOCYTESUR NEGATIVE 07/19/2024 1340   Sepsis Labs: @LABRCNTIP (procalcitonin:4,lacticidven:4) )No results found for this or any previous visit (from the past 240 hours).    Radiological Exams on Admission: CT ABDOMEN PELVIS W CONTRAST Result Date: 07/19/2024 CLINICAL DATA:  Bowel obstruction suspected. Pulmonary embolism suspected, high probability. EXAM: CT ANGIOGRAPHY CHEST CT ABDOMEN AND PELVIS WITH CONTRAST TECHNIQUE: Multidetector CT imaging of the chest was performed using the standard protocol during bolus administration of intravenous contrast. Multiplanar CT image reconstructions and MIPs were obtained to evaluate the vascular anatomy. Multidetector CT imaging of the abdomen and pelvis was performed using the standard protocol during bolus administration of intravenous contrast. RADIATION DOSE REDUCTION: This exam was performed according to the departmental dose-optimization program which includes automated exposure control, adjustment of the mA and/or kV according to patient size and/or use of iterative reconstruction technique. CONTRAST:  100mL OMNIPAQUE IOHEXOL 350 MG/ML SOLN COMPARISON:  None Available. FINDINGS: CTA CHEST FINDINGS Cardiovascular: The heart is enlarged and no significant pericardial effusion is seen. Scattered coronary artery calcifications are noted. The aorta is normal in caliber. The pulmonary trunk is mildly distended which may be associated with underlying pulmonary artery hypertension. No  definite pulmonary embolism is seen. Evaluation of the pulmonary arteries is limited due to mixing artifact and respiratory motion. Mediastinum/Nodes: No mediastinal, hilar, or axillary lymphadenopathy by size criteria. The thyroid gland, trachea, and esophagus are within normal limits. Lungs/Pleura: There is a trace left pleural effusion. Multifocal strandy opacities and consolidation are seen at the lung bases bilaterally. No pneumothorax. Musculoskeletal: Degenerative changes are present in the thoracic spine. No acute osseous abnormality. Review of the MIP images confirms the above findings. CT ABDOMEN and PELVIS FINDINGS Hepatobiliary: No focal  abnormality in the liver. No biliary ductal dilatation is seen. Multiple stones are present within the gallbladder. Pancreas: The pancreas is within normal limits. No pancreatic ductal dilatation. Spleen: Normal size. There is a wedge-shaped defect in the posterior aspect of the spleen with perisplenic complex free fluid extending into the left upper quadrant and about the greater curvature of the stomach. Adrenals/Urinary Tract: The adrenal glands are within normal limits. The kidneys enhance symmetrically. No renal calculus or hydronephrosis. The bladder is unremarkable. Stomach/Bowel: The stomach is not well delineated due to a complex fluid collection adjacent to the greater curvature and fundus measuring 15.4 x 7.3 x 7.7 cm. There is mild gastric wall thickening with Peri grass trick fat stranding. There is no bowel obstruction, free air, or pneumatosis. Scattered diverticula are noted along the colon without evidence of diverticulitis. Appendix appears normal. Vascular/Lymphatic: There is a hyperdense structure at the splenic hilum measuring 2.5 x 1.7 cm with surrounding hypodense rim measuring 4.0 x 2.9 cm, query splenic artery aneurysm. Examination is limited due timing of contrast bolus. Aorta is normal in caliber. Reproductive: Prostate gland is enlarged. Other: Complex free fluid is noted in the abdomen and pelvis a, likely originating in the left upper quadrant with attenuation most concerning for hemorrhage. A E fat containing umbilical hernia is present. There is a fat containing inguinal hernia on the left. Musculoskeletal: Degenerative changes are present in the lumbar spine. No acute fracture is seen. Review of the MIP images confirms the above findings. IMPRESSION: 1. Large amount of complex free fluid in the abdomen and pelvis, predominantly centered in the left upper quadrant concerning for hemoperitoneum. An oval structure with a central hyperdense region is seen at the splenic hilum measuring  up to 4 cm, suggesting splenic artery aneurysm with possible rupture. Interventional radiology consultation is recommended. 2. Wedge-shaped defect in the posterior aspect of the spleen with surrounding complex free fluid, differential considerations include infarct, laceration if there is history of trauma, or morphologic cleft. 3. Gastric wall thickening with adjacent hyperdense fluid collection and perigastric fat stranding, may be associated to local inflammatory changes. No free air is seen, however gastric ideology is not completely excluded. 4. No evidence of pulmonary embolism. 5. Trace left pleural effusion. 6. Strandy opacities and consolidation at the lung bases, possible atelectasis or infiltrate 7. Remaining incidental findings as described above. Critical Value/emergent results were called by telephone at the time of interpretation on 07/19/2024 at 3:05 pm to provider [facility] , who verbally acknowledged these results. Electronically Signed   By: Leita Birmingham M.D.   On: 07/19/2024 15:12   CT Angio Chest PE W and/or Wo Contrast Result Date: 07/19/2024 CLINICAL DATA:  Bowel obstruction suspected. Pulmonary embolism suspected, high probability. EXAM: CT ANGIOGRAPHY CHEST CT ABDOMEN AND PELVIS WITH CONTRAST TECHNIQUE: Multidetector CT imaging of the chest was performed using the standard protocol during bolus administration of intravenous contrast. Multiplanar CT image reconstructions and MIPs were obtained to  evaluate the vascular anatomy. Multidetector CT imaging of the abdomen and pelvis was performed using the standard protocol during bolus administration of intravenous contrast. RADIATION DOSE REDUCTION: This exam was performed according to the departmental dose-optimization program which includes automated exposure control, adjustment of the mA and/or kV according to patient size and/or use of iterative reconstruction technique. CONTRAST:  100mL OMNIPAQUE IOHEXOL 350 MG/ML SOLN COMPARISON:   None Available. FINDINGS: CTA CHEST FINDINGS Cardiovascular: The heart is enlarged and no significant pericardial effusion is seen. Scattered coronary artery calcifications are noted. The aorta is normal in caliber. The pulmonary trunk is mildly distended which may be associated with underlying pulmonary artery hypertension. No definite pulmonary embolism is seen. Evaluation of the pulmonary arteries is limited due to mixing artifact and respiratory motion. Mediastinum/Nodes: No mediastinal, hilar, or axillary lymphadenopathy by size criteria. The thyroid gland, trachea, and esophagus are within normal limits. Lungs/Pleura: There is a trace left pleural effusion. Multifocal strandy opacities and consolidation are seen at the lung bases bilaterally. No pneumothorax. Musculoskeletal: Degenerative changes are present in the thoracic spine. No acute osseous abnormality. Review of the MIP images confirms the above findings. CT ABDOMEN and PELVIS FINDINGS Hepatobiliary: No focal abnormality in the liver. No biliary ductal dilatation is seen. Multiple stones are present within the gallbladder. Pancreas: The pancreas is within normal limits. No pancreatic ductal dilatation. Spleen: Normal size. There is a wedge-shaped defect in the posterior aspect of the spleen with perisplenic complex free fluid extending into the left upper quadrant and about the greater curvature of the stomach. Adrenals/Urinary Tract: The adrenal glands are within normal limits. The kidneys enhance symmetrically. No renal calculus or hydronephrosis. The bladder is unremarkable. Stomach/Bowel: The stomach is not well delineated due to a complex fluid collection adjacent to the greater curvature and fundus measuring 15.4 x 7.3 x 7.7 cm. There is mild gastric wall thickening with Peri grass trick fat stranding. There is no bowel obstruction, free air, or pneumatosis. Scattered diverticula are noted along the colon without evidence of diverticulitis.  Appendix appears normal. Vascular/Lymphatic: There is a hyperdense structure at the splenic hilum measuring 2.5 x 1.7 cm with surrounding hypodense rim measuring 4.0 x 2.9 cm, query splenic artery aneurysm. Examination is limited due timing of contrast bolus. Aorta is normal in caliber. Reproductive: Prostate gland is enlarged. Other: Complex free fluid is noted in the abdomen and pelvis a, likely originating in the left upper quadrant with attenuation most concerning for hemorrhage. A E fat containing umbilical hernia is present. There is a fat containing inguinal hernia on the left. Musculoskeletal: Degenerative changes are present in the lumbar spine. No acute fracture is seen. Review of the MIP images confirms the above findings. IMPRESSION: 1. Large amount of complex free fluid in the abdomen and pelvis, predominantly centered in the left upper quadrant concerning for hemoperitoneum. 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 Interventional radiology consultation is recommended. 2. Wedge-shaped defect in the posterior aspect of the spleen with surrounding complex free fluid, differential considerations include infarct, laceration if there is history of trauma, or morphologic cleft. 3. Gastric wall thickening with adjacent hyperdense fluid collection and perigastric fat stranding, may be associated to local inflammatory changes. No free air is seen, however gastric ideology is not completely excluded. 4. No evidence of pulmonary embolism. 5. Trace left pleural effusion. 6. Strandy opacities and consolidation at the lung bases,  possible atelectasis or infiltrate 7. Remaining incidental findings as described above. Critical Value/emergent results were called by telephone at the time of interpretation on 07/19/2024 at 3:05 pm to provider [facility] , who verbally acknowledged these results.  Electronically Signed   By: Leita Birmingham M.D.   On: 07/19/2024 15:12    EKG: Independently reviewed. Atrial fibrillation, rate 150, LAD.   Assessment/Plan   1. Ruptured splenic artery aneurysm  - Now immediately s/p coil embolization of large splenic artery aneurysm  - Monitor H&H, continue supportive care, anticipate pain and inflammatory response from splenic infarction   2. Rapid atrial fibrillation  - HR improved to 110s with IV Lopressor, BP is stable  - This is a new diagnosis, possibly precipitated by the acute illness  - CHADS-VASc is 1 (HTN) - Start oral Lopressor, check mag, TSH, and echocardiogram    3. Hypertension  - Pt reports taking one antihypertensive medication but unsure of name  - Starting oral Lopressor for now    DVT prophylaxis: SCDs  Code Status: Full  Level of Care: Level of care: Progressive Family Communication: Wife and daughter at bedside  Disposition Plan:  Patient is from: Home  Anticipated d/c is to: Home  Anticipated d/c date is: 07/21/24  Patient currently: Pending clinical stability  Consults called: IR  Admission status: Inpatient     Evalene GORMAN Sprinkles, MD [facility]  07/19/2024, 9:27 PM

## 2024-07-19 NOTE — Consult Note (Signed)
 Chief Complaint: Patient was seen in consultation today for  Chief Complaint  Patient presents with   Abdominal Pain   at the request of Benton Shone, MD  Referring Physician(s): Benton Shone, MD  Supervising Physician: Karalee Beat  Patient Status: [facility] - ED  History of Present Illness: Marcus Scott is a 63 y.o. 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  He experienced acute onset abdominal pain 2 days ago on Thursday and has had progressive abdominal distention, pain in the LUQ and MEG regions and constipation.  He also reports poor appetite.   CT was positive for hemoperitoneum and splenic artery aneurysm.    Dr. Paola of trauma surgery saw him and feels he is stable enough for us  to attempt endovascular repair.  He is awake, alert and very cooperative.  HR is very fast c/w RVR but he is asymptomatic.  I explained in detail the procedure of angiogram with embolization and answered his and his wifes questions.  They understand and wish to proceed.   Past Medical History:  Diagnosis Date   Hypertension    STATES BP HAS BEEN HIGH FOR QUIET SOME TIME, NO MEDS, NO PCP    Past Surgical History:  Procedure Laterality Date   PATELLA RECONSTRUCTION Right 2021   at [facility]   REPAIR EXTENSOR TENDON Right 01/24/2024   Procedure: REPAIR, TENDON, EXTENSOR;  Surgeon: Arlinda Buster, MD;  Location: [facility] OR;  Service: Orthopedics;  Laterality: Right;  RIGHT HAND WOUND EXPLORATION WITH EXTENSOR TENDON REPAIR    Allergies: Patient has no known allergies.  Medications: Prior to Admission medications   Medication Sig Start Date End Date Taking? Authorizing Provider  ascorbic acid (VITAMIN C) 500 MG tablet Take 500 mg by mouth daily.    [provider]  cephALEXin  (KEFLEX ) 500 MG capsule Take 1 capsule (500 mg total) by mouth 3 (three) times daily. 01/19/24   Patt Alm Macho, MD  oxyCODONE   (ROXICODONE ) 5 MG immediate release tablet Take 1 tablet (5 mg total) by mouth every 6 (six) hours as needed for severe pain (pain score 7-10). 01/24/24   Agarwala, Anshul, MD  oxyCODONE -acetaminophen  (PERCOCET) 5-325 MG tablet Take 1 tablet by mouth every 6 (six) hours as needed. 01/19/24   Patt Alm Macho, MD  Vitamin D, Cholecalciferol, 25 MCG (1000 UT) CAPS Take by mouth.    [provider]  zinc gluconate 50 MG tablet Take 50 mg by mouth daily.    [provider]     History reviewed. No pertinent family history.  Social History   Socioeconomic History   Marital status: Married    Spouse name: Not on file   Number of children: Not on file   Years of education: Not on file   Highest education level: Not on file  Occupational History   Not on file  Tobacco Use   Smoking status: Never   Smokeless tobacco: Never  Vaping Use   Vaping status: Never Used  Substance and Sexual Activity   Alcohol use: Not Currently   Drug use: Never   Sexual activity: Yes  Other Topics Concern   Not on file  Social History Narrative   Not on file   Social Drivers of Health   Financial Resource Strain: Not on file  Food Insecurity: Not on file  Transportation Needs: Not on file  Physical Activity: Not on file  Stress: Not on file  Social Connections: Not on file  Review of Systems: A 12 point ROS discussed and pertinent positives are indicated in the HPI above.  All other systems are negative.  Review of Systems  Vital Signs: BP (!) 134/97 (BP Location: Left Arm)   Pulse (!) 104   Temp 98.2 F (36.8 C)   Resp 18   SpO2 94%     Physical Exam Constitutional:      Appearance: He is well-developed. He is obese.  HENT:     Head: Normocephalic and atraumatic.  Cardiovascular:     Rate and Rhythm: Tachycardia present. Rhythm irregular.  Pulmonary:     Effort: Pulmonary effort is normal.  Abdominal:     General: Abdomen is protuberant. There is distension.      Palpations: Abdomen is soft.     Tenderness: There is abdominal tenderness in the epigastric area and left upper quadrant.  Skin:    General: Skin is warm and dry.  Neurological:     Mental Status: He is alert and oriented to person, place, and time.  Psychiatric:        Mood and Affect: Mood normal.        Behavior: Behavior normal.     Imaging: CT ABDOMEN PELVIS W CONTRAST Result Date: 07/19/2024 CLINICAL DATA:  Bowel obstruction suspected. Pulmonary embolism suspected, high probability. EXAM: CT ANGIOGRAPHY CHEST CT ABDOMEN AND PELVIS WITH CONTRAST TECHNIQUE: Multidetector CT imaging of the chest was performed using the standard protocol during bolus administration of intravenous contrast. Multiplanar CT image reconstructions and MIPs were obtained to evaluate the vascular anatomy. Multidetector CT imaging of the abdomen and pelvis was performed using the standard protocol during bolus administration of intravenous contrast. RADIATION DOSE REDUCTION: This exam was performed according to the departmental dose-optimization program which includes automated exposure control, adjustment of the mA and/or kV according to patient size and/or use of iterative reconstruction technique. CONTRAST:  100mL OMNIPAQUE IOHEXOL 350 MG/ML SOLN COMPARISON:  None Available. FINDINGS: CTA CHEST FINDINGS Cardiovascular: The heart is enlarged and no significant pericardial effusion is seen. Scattered coronary artery calcifications are noted. The aorta is normal in caliber. The pulmonary trunk is mildly distended which may be associated with underlying pulmonary artery hypertension. No definite pulmonary embolism is seen. Evaluation of the pulmonary arteries is limited due to mixing artifact and respiratory motion. Mediastinum/Nodes: No mediastinal, hilar, or axillary lymphadenopathy by size criteria. The thyroid gland, trachea, and esophagus are within normal limits. Lungs/Pleura: There is a trace left pleural effusion.  Multifocal strandy opacities and consolidation are seen at the lung bases bilaterally. No pneumothorax. Musculoskeletal: Degenerative changes are present in the thoracic spine. No acute osseous abnormality. Review of the MIP images confirms the above findings. CT ABDOMEN and PELVIS FINDINGS Hepatobiliary: No focal abnormality in the liver. No biliary ductal dilatation is seen. Multiple stones are present within the gallbladder. Pancreas: The pancreas is within normal limits. No pancreatic ductal dilatation. Spleen: Normal size. There is a wedge-shaped defect in the posterior aspect of the spleen with perisplenic complex free fluid extending into the left upper quadrant and about the greater curvature of the stomach. Adrenals/Urinary Tract: The adrenal glands are within normal limits. The kidneys enhance symmetrically. No renal calculus or hydronephrosis. The bladder is unremarkable. Stomach/Bowel: The stomach is not well delineated due to a complex fluid collection adjacent to the greater curvature and fundus measuring 15.4 x 7.3 x 7.7 cm. There is mild gastric wall thickening with Peri grass trick fat stranding. There is no bowel obstruction, free air,  or pneumatosis. Scattered diverticula are noted along the colon without evidence of diverticulitis. Appendix appears normal. Vascular/Lymphatic: There is a hyperdense structure at the splenic hilum measuring 2.5 x 1.7 cm with surrounding hypodense rim measuring 4.0 x 2.9 cm, query splenic artery aneurysm. Examination is limited due timing of contrast bolus. Aorta is normal in caliber. Reproductive: Prostate gland is enlarged. Other: Complex free fluid is noted in the abdomen and pelvis a, likely originating in the left upper quadrant with attenuation most concerning for hemorrhage. A E fat containing umbilical hernia is present. There is a fat containing inguinal hernia on the left. Musculoskeletal: Degenerative changes are present in the lumbar spine. No acute  fracture is seen. Review of the MIP images confirms the above findings. IMPRESSION: 1. Large amount of complex free fluid in the abdomen and pelvis, predominantly centered in the left upper quadrant concerning for hemoperitoneum. 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 Interventional radiology consultation is recommended. 2. Wedge-shaped defect in the posterior aspect of the spleen with surrounding complex free fluid, differential considerations include infarct, laceration if there is history of trauma, or morphologic cleft. 3. Gastric wall thickening with adjacent hyperdense fluid collection and perigastric fat stranding, may be associated to local inflammatory changes. No free air is seen, however gastric ideology is not completely excluded. 4. No evidence of pulmonary embolism. 5. Trace left pleural effusion. 6. Strandy opacities and consolidation at the lung bases, possible atelectasis or infiltrate 7. Remaining incidental findings as described above. Critical Value/emergent results were called by telephone at the time of interpretation on 07/19/2024 at 3:05 pm to provider [facility] , who verbally acknowledged these results. Electronically Signed   By: Leita Birmingham M.D.   On: 07/19/2024 15:12   CT Angio Chest PE W and/or Wo Contrast Result Date: 07/19/2024 CLINICAL DATA:  Bowel obstruction suspected. Pulmonary embolism suspected, high probability. EXAM: CT ANGIOGRAPHY CHEST CT ABDOMEN AND PELVIS WITH CONTRAST TECHNIQUE: Multidetector CT imaging of the chest was performed using the standard protocol during bolus administration of intravenous contrast. Multiplanar CT image reconstructions and MIPs were obtained to evaluate the vascular anatomy. Multidetector CT imaging of the abdomen and pelvis was performed using the standard protocol during bolus administration of intravenous contrast.  RADIATION DOSE REDUCTION: This exam was performed according to the departmental dose-optimization program which includes automated exposure control, adjustment of the mA and/or kV according to patient size and/or use of iterative reconstruction technique. CONTRAST:  100mL OMNIPAQUE IOHEXOL 350 MG/ML SOLN COMPARISON:  None Available. FINDINGS: CTA CHEST FINDINGS Cardiovascular: The heart is enlarged and no significant pericardial effusion is seen. Scattered coronary artery calcifications are noted. The aorta is normal in caliber. The pulmonary trunk is mildly distended which may be associated with underlying pulmonary artery hypertension. No definite pulmonary embolism is seen. Evaluation of the pulmonary arteries is limited due to mixing artifact and respiratory motion. Mediastinum/Nodes: No mediastinal, hilar, or axillary lymphadenopathy by size criteria. The thyroid gland, trachea, and esophagus are within normal limits. Lungs/Pleura: There is a trace left pleural effusion. Multifocal strandy opacities and consolidation are seen at the lung bases bilaterally. No pneumothorax. Musculoskeletal: Degenerative changes are present in the thoracic spine. No acute osseous abnormality. Review of the MIP images confirms the above findings. CT ABDOMEN and PELVIS FINDINGS Hepatobiliary: No focal abnormality in the liver. No biliary ductal dilatation is seen. Multiple stones are present within the  gallbladder. Pancreas: The pancreas is within normal limits. No pancreatic ductal dilatation. Spleen: Normal size. There is a wedge-shaped defect in the posterior aspect of the spleen with perisplenic complex free fluid extending into the left upper quadrant and about the greater curvature of the stomach. Adrenals/Urinary Tract: The adrenal glands are within normal limits. The kidneys enhance symmetrically. No renal calculus or hydronephrosis. The bladder is unremarkable. Stomach/Bowel: The stomach is not well delineated due to a  complex fluid collection adjacent to the greater curvature and fundus measuring 15.4 x 7.3 x 7.7 cm. There is mild gastric wall thickening with Peri grass trick fat stranding. There is no bowel obstruction, free air, or pneumatosis. Scattered diverticula are noted along the colon without evidence of diverticulitis. Appendix appears normal. Vascular/Lymphatic: There is a hyperdense structure at the splenic hilum measuring 2.5 x 1.7 cm with surrounding hypodense rim measuring 4.0 x 2.9 cm, query splenic artery aneurysm. Examination is limited due timing of contrast bolus. Aorta is normal in caliber. Reproductive: Prostate gland is enlarged. Other: Complex free fluid is noted in the abdomen and pelvis a, likely originating in the left upper quadrant with attenuation most concerning for hemorrhage. A E fat containing umbilical hernia is present. There is a fat containing inguinal hernia on the left. Musculoskeletal: Degenerative changes are present in the lumbar spine. No acute fracture is seen. Review of the MIP images confirms the above findings. IMPRESSION: 1. Large amount of complex free fluid in the abdomen and pelvis, predominantly centered in the left upper quadrant concerning for hemoperitoneum. 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 Interventional radiology consultation is recommended. 2. Wedge-shaped defect in the posterior aspect of the spleen with surrounding complex free fluid, differential considerations include infarct, laceration if there is history of trauma, or morphologic cleft. 3. Gastric wall thickening with adjacent hyperdense fluid collection and perigastric fat stranding, may be associated to local inflammatory changes. No free air is seen, however gastric ideology is not completely excluded. 4. No evidence of pulmonary embolism. 5. Trace left pleural effusion. 6. Strandy  opacities and consolidation at the lung bases, possible atelectasis or infiltrate 7. Remaining incidental findings as described above. Critical Value/emergent results were called by telephone at the time of interpretation on 07/19/2024 at 3:05 pm to provider [facility] , who verbally acknowledged these results. Electronically Signed   By: Leita Birmingham M.D.   On: 07/19/2024 15:12    Labs:  CBC: Recent Labs    07/19/24 1311  WBC 13.5*  HGB 10.9*  HCT 32.2*  PLT 326    COAGS: No results for input(s): INR, APTT in the last 8760 hours.  BMP: Recent Labs    07/19/24 1311  NA 135  K 3.6  CL 98  CO2 23  GLUCOSE 123*  BUN 16  CALCIUM 9.8  CREATININE 1.04  GFRNONAA >60    LIVER FUNCTION TESTS: Recent Labs    07/19/24 1311  BILITOT 1.3*  AST 17  ALT 15  ALKPHOS 48  PROT 7.1  ALBUMIN 4.3    TUMOR MARKERS: No results for input(s): AFPTM, CEA, CA199, CHROMGRNA in the last 8760 hours.  Assessment and Plan:  63 year-old male with ruptured but contained splenic artery aneurysm.  While he does have afib with RVR, he remains stable enough to attempt endovascular repair rather than emergent splenectomy.  Dr. Paola agrees.   1.) To IR  for angiogram and embolization  Thank you for this interesting consult.  I greatly enjoyed meeting Marcus Scott and look forward to participating in their care.  A copy of this report was sent to the requesting provider on this date.  Electronically Signed: Wilkie MARLA Lent, MD 07/19/2024, 7:37 PM   I spent a total of 80 Minutes   in face to face in clinical consultation, greater than 50% of which was counseling/coordinating care for splenic aneurysm rupture

## 2024-07-19 NOTE — ED Notes (Signed)
 Called [facility] to transport the patient to [place] emergency--[place]--[place] receiving

## 2024-07-19 NOTE — Progress Notes (Signed)
 Report from [facility] after transport from [facility]: Pt went into [facility] with abdominal pain and distention. Ct showed a splenic aneurysm that is 3-4 cm with rupture. Pt is currently on second unit of blood.The first unit of pack cells was started at 1524, the second unit was started at 1550 in route to [place].  US  piv 20 in right forearm placed by [facility].  Vital: 168/102 Hr 160 aflutter Spo2 98% on room air RR 16

## 2024-07-19 NOTE — ED Notes (Signed)
 [facility] arrives for transport. 2nd unit supplied to [facility] to start en routeGLENWOOD Moulder MD aware. Pt tolerating blood product well. Transfers to stretcher without incident.

## 2024-07-19 NOTE — ED Triage Notes (Signed)
 Pt reports worsening feeling of fullness and pressure since earlier this week and states he has not had a normal bowl mov't since Thursday.  Pt reports thurs night he had an episode of intense nausea and chills.  Pt reports he feels very bloated with diffuse abdominal pain.  Pt denies h/o cardiac concerns or prior bowl issues.  Pt takes BP medications but did not take his meds today and reports decreased appetite since Thursday as well.

## 2024-07-19 NOTE — Progress Notes (Signed)
 Seen and examined. Stable for IR AE. Case d/w Dr. Karalee. 2u pRBC hanging. AFRVR vs flutter. Surgery available if AE unsuccessful.   Dreama GEANNIE Hanger, MD General and Trauma Surgery [facility] Surgery

## 2024-07-19 NOTE — Procedures (Signed)
 Interventional Radiology Procedure Note  Procedure: Splenic angiogram and coil embolization of large splenic artery aneurysm.  Right common femoral artery access, 35F, closed with Celt.  Complications: None  Estimated Blood Loss: None  Recommendations: - Bedrest x 1 hr - Ok for regular diet - No need for ICU - Can expect some pain due to partial splenic infarction over the coming days    Signed,  Wilkie LOIS Lent, MD

## 2024-07-19 NOTE — ED Provider Notes (Signed)
 [place] EMERGENCY DEPARTMENT AT [facility] Provider Note   CSN: [id number] Arrival date & time: 07/19/24  1234     Patient presents with: Abdominal Pain   Mackinley Kiehn is a 63 y.o. male.   HPI   63 year old male presenting to the emergency department with abdominal distention and swelling.  The patient states that he has had a feeling of fullness and pressure since earlier this week and he has not had a normal bowel movement since this past Thursday.  Thursday night he had an episode of intense nausea and chills.  He has been feeling extremely bloated with diffuse abdominal pain.  He denies any chest pain, has felt mild shortness of breath with associated abdominal swelling.  He takes blood pressure medications but did not take his medication today.  He has had decreased appetite and has only been tolerating p.o. liquids over the past several days.  He denies any active vomiting.  He is only moving small amounts of his bowels, had a very small stringy bowel movement earlier this morning, is passing gas.  Prior to Admission medications   Medication Sig Start Date End Date Taking? Authorizing Provider  ascorbic acid (VITAMIN C) 500 MG tablet Take 500 mg by mouth daily.    [provider]  cephALEXin  (KEFLEX ) 500 MG capsule Take 1 capsule (500 mg total) by mouth 3 (three) times daily. 01/19/24   Patt Alm Macho, MD  oxyCODONE  (ROXICODONE ) 5 MG immediate release tablet Take 1 tablet (5 mg total) by mouth every 6 (six) hours as needed for severe pain (pain score 7-10). 01/24/24   Agarwala, Anshul, MD  oxyCODONE -acetaminophen  (PERCOCET) 5-325 MG tablet Take 1 tablet by mouth every 6 (six) hours as needed. 01/19/24   Patt Alm Macho, MD  Vitamin D, Cholecalciferol, 25 MCG (1000 UT) CAPS Take by mouth.    [provider]  zinc gluconate 50 MG tablet Take 50 mg by mouth daily.    [provider]    Allergies: Patient has no known allergies.     Review of Systems  All other systems reviewed and are negative.   Updated Vital Signs BP (!) 141/120   Pulse (!) 160   Temp 98.5 F (36.9 C) (Oral)   Resp (!) 22   SpO2 98%   Physical Exam Vitals and nursing note reviewed.  Constitutional:      General: He is not in acute distress.    Appearance: He is well-developed.  HENT:     Head: Normocephalic and atraumatic.  Eyes:     Conjunctiva/sclera: Conjunctivae normal.  Cardiovascular:     Rate and Rhythm: Tachycardia present. Rhythm irregular.     Heart sounds: No murmur heard. Pulmonary:     Effort: Pulmonary effort is normal. No respiratory distress.     Breath sounds: Normal breath sounds.  Abdominal:     General: There is distension.     Palpations: Abdomen is soft.     Tenderness: There is generalized abdominal tenderness. There is guarding.  Musculoskeletal:        General: No swelling.     Cervical back: Neck supple.  Skin:    General: Skin is warm and dry.     Capillary Refill: Capillary refill takes less than 2 seconds.  Neurological:     Mental Status: He is alert.  Psychiatric:        Mood and Affect: Mood normal.     (all labs ordered are listed, but only abnormal results  are displayed) Labs Reviewed  CBC WITH DIFFERENTIAL/PLATELET - Abnormal; Notable for the following components:      Result Value   WBC 13.5 (*)    RBC 3.80 (*)    Hemoglobin 10.9 (*)    HCT 32.2 (*)    Neutro Abs 10.2 (*)    All other components within normal limits  COMPREHENSIVE METABOLIC PANEL WITH GFR - Abnormal; Notable for the following components:   Glucose, Bld 123 (*)    Total Bilirubin 1.3 (*)    All other components within normal limits  PRO BRAIN NATRIURETIC PEPTIDE - Abnormal; Notable for the following components:   Pro Brain Natriuretic Peptide 832.0 (*)    All other components within normal limits  URINALYSIS, W/ REFLEX TO CULTURE (INFECTION SUSPECTED) - Abnormal; Notable for the following components:    Specific Gravity, Urine 1.035 (*)    All other components within normal limits  TROPONIN T, HIGH SENSITIVITY - Abnormal; Notable for the following components:   Troponin T High Sensitivity 30 (*)    All other components within normal limits  CULTURE, BLOOD (ROUTINE X 2)  CULTURE, BLOOD (ROUTINE X 2)  LIPASE, BLOOD  PREPARE RBC (CROSSMATCH)    EKG: EKG Interpretation Date/Time:  Saturday July 19 2024 12:58:51 EDT Ventricular Rate:  150 PR Interval:    QRS Duration:  104 QT Interval:  308 QTC Calculation: 487 R Axis:   -31  Text Interpretation: Atrial fibrillation with rvr Left axis deviation Abnormal R-wave progression, late transition Borderline prolonged QT interval Confirmed by Jerrol Agent ([id number]) on 07/19/2024 1:07:44 PM  Radiology: CT ABDOMEN PELVIS W CONTRAST Result Date: 07/19/2024 CLINICAL DATA:  Bowel obstruction suspected. Pulmonary embolism suspected, high probability. EXAM: CT ANGIOGRAPHY CHEST CT ABDOMEN AND PELVIS WITH CONTRAST TECHNIQUE: Multidetector CT imaging of the chest was performed using the standard protocol during bolus administration of intravenous contrast. Multiplanar CT image reconstructions and MIPs were obtained to evaluate the vascular anatomy. Multidetector CT imaging of the abdomen and pelvis was performed using the standard protocol during bolus administration of intravenous contrast. RADIATION DOSE REDUCTION: This exam was performed according to the departmental dose-optimization program which includes automated exposure control, adjustment of the mA and/or kV according to patient size and/or use of iterative reconstruction technique. CONTRAST:  100mL OMNIPAQUE IOHEXOL 350 MG/ML SOLN COMPARISON:  None Available. FINDINGS: CTA CHEST FINDINGS Cardiovascular: The heart is enlarged and no significant pericardial effusion is seen. Scattered coronary artery calcifications are noted. The aorta is normal in caliber. The pulmonary trunk is mildly distended which  may be associated with underlying pulmonary artery hypertension. No definite pulmonary embolism is seen. Evaluation of the pulmonary arteries is limited due to mixing artifact and respiratory motion. Mediastinum/Nodes: No mediastinal, hilar, or axillary lymphadenopathy by size criteria. The thyroid gland, trachea, and esophagus are within normal limits. Lungs/Pleura: There is a trace left pleural effusion. Multifocal strandy opacities and consolidation are seen at the lung bases bilaterally. No pneumothorax. Musculoskeletal: Degenerative changes are present in the thoracic spine. No acute osseous abnormality. Review of the MIP images confirms the above findings. CT ABDOMEN and PELVIS FINDINGS Hepatobiliary: No focal abnormality in the liver. No biliary ductal dilatation is seen. Multiple stones are present within the gallbladder. Pancreas: The pancreas is within normal limits. No pancreatic ductal dilatation. Spleen: Normal size. There is a wedge-shaped defect in the posterior aspect of the spleen with perisplenic complex free fluid extending into the left upper quadrant and about the greater curvature of the stomach. Adrenals/Urinary  Tract: The adrenal glands are within normal limits. The kidneys enhance symmetrically. No renal calculus or hydronephrosis. The bladder is unremarkable. Stomach/Bowel: The stomach is not well delineated due to a complex fluid collection adjacent to the greater curvature and fundus measuring 15.4 x 7.3 x 7.7 cm. There is mild gastric wall thickening with Peri grass trick fat stranding. There is no bowel obstruction, free air, or pneumatosis. Scattered diverticula are noted along the colon without evidence of diverticulitis. Appendix appears normal. Vascular/Lymphatic: There is a hyperdense structure at the splenic hilum measuring 2.5 x 1.7 cm with surrounding hypodense rim measuring 4.0 x 2.9 cm, query splenic artery aneurysm. Examination is limited due timing of contrast bolus. Aorta  is normal in caliber. Reproductive: Prostate gland is enlarged. Other: Complex free fluid is noted in the abdomen and pelvis a, likely originating in the left upper quadrant with attenuation most concerning for hemorrhage. A E fat containing umbilical hernia is present. There is a fat containing inguinal hernia on the left. Musculoskeletal: Degenerative changes are present in the lumbar spine. No acute fracture is seen. Review of the MIP images confirms the above findings. IMPRESSION: 1. Large amount of complex free fluid in the abdomen and pelvis, predominantly centered in the left upper quadrant concerning for hemoperitoneum. 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 Interventional radiology consultation is recommended. 2. Wedge-shaped defect in the posterior aspect of the spleen with surrounding complex free fluid, differential considerations include infarct, laceration if there is history of trauma, or morphologic cleft. 3. Gastric wall thickening with adjacent hyperdense fluid collection and perigastric fat stranding, may be associated to local inflammatory changes. No free air is seen, however gastric ideology is not completely excluded. 4. No evidence of pulmonary embolism. 5. Trace left pleural effusion. 6. Strandy opacities and consolidation at the lung bases, possible atelectasis or infiltrate 7. Remaining incidental findings as described above. Critical Value/emergent results were called by telephone at the time of interpretation on 07/19/2024 at 3:05 pm to provider [facility] , who verbally acknowledged these results. Electronically Signed   By: Leita Birmingham M.D.   On: 07/19/2024 15:12   CT Angio Chest PE W and/or Wo Contrast Result Date: 07/19/2024 CLINICAL DATA:  Bowel obstruction suspected. Pulmonary embolism suspected, high probability. EXAM: CT ANGIOGRAPHY CHEST CT ABDOMEN  AND PELVIS WITH CONTRAST TECHNIQUE: Multidetector CT imaging of the chest was performed using the standard protocol during bolus administration of intravenous contrast. Multiplanar CT image reconstructions and MIPs were obtained to evaluate the vascular anatomy. Multidetector CT imaging of the abdomen and pelvis was performed using the standard protocol during bolus administration of intravenous contrast. RADIATION DOSE REDUCTION: This exam was performed according to the departmental dose-optimization program which includes automated exposure control, adjustment of the mA and/or kV according to patient size and/or use of iterative reconstruction technique. CONTRAST:  100mL OMNIPAQUE IOHEXOL 350 MG/ML SOLN COMPARISON:  None Available. FINDINGS: CTA CHEST FINDINGS Cardiovascular: The heart is enlarged and no significant pericardial effusion is seen. Scattered coronary artery calcifications are noted. The aorta is normal in caliber. The pulmonary trunk is mildly distended which may be associated with underlying pulmonary artery hypertension. No definite pulmonary embolism is seen. Evaluation of the pulmonary arteries is limited due to mixing artifact and respiratory motion. Mediastinum/Nodes: No mediastinal, hilar, or axillary lymphadenopathy by size criteria. The thyroid gland, trachea, and esophagus are within normal limits. Lungs/Pleura:  There is a trace left pleural effusion. Multifocal strandy opacities and consolidation are seen at the lung bases bilaterally. No pneumothorax. Musculoskeletal: Degenerative changes are present in the thoracic spine. No acute osseous abnormality. Review of the MIP images confirms the above findings. CT ABDOMEN and PELVIS FINDINGS Hepatobiliary: No focal abnormality in the liver. No biliary ductal dilatation is seen. Multiple stones are present within the gallbladder. Pancreas: The pancreas is within normal limits. No pancreatic ductal dilatation. Spleen: Normal size. There is a  wedge-shaped defect in the posterior aspect of the spleen with perisplenic complex free fluid extending into the left upper quadrant and about the greater curvature of the stomach. Adrenals/Urinary Tract: The adrenal glands are within normal limits. The kidneys enhance symmetrically. No renal calculus or hydronephrosis. The bladder is unremarkable. Stomach/Bowel: The stomach is not well delineated due to a complex fluid collection adjacent to the greater curvature and fundus measuring 15.4 x 7.3 x 7.7 cm. There is mild gastric wall thickening with Peri grass trick fat stranding. There is no bowel obstruction, free air, or pneumatosis. Scattered diverticula are noted along the colon without evidence of diverticulitis. Appendix appears normal. Vascular/Lymphatic: There is a hyperdense structure at the splenic hilum measuring 2.5 x 1.7 cm with surrounding hypodense rim measuring 4.0 x 2.9 cm, query splenic artery aneurysm. Examination is limited due timing of contrast bolus. Aorta is normal in caliber. Reproductive: Prostate gland is enlarged. Other: Complex free fluid is noted in the abdomen and pelvis a, likely originating in the left upper quadrant with attenuation most concerning for hemorrhage. A E fat containing umbilical hernia is present. There is a fat containing inguinal hernia on the left. Musculoskeletal: Degenerative changes are present in the lumbar spine. No acute fracture is seen. Review of the MIP images confirms the above findings. IMPRESSION: 1. Large amount of complex free fluid in the abdomen and pelvis, predominantly centered in the left upper quadrant concerning for hemoperitoneum. 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 Interventional radiology consultation is recommended. 2. Wedge-shaped defect in the posterior aspect of the spleen with surrounding complex free fluid,  differential considerations include infarct, laceration if there is history of trauma, or morphologic cleft. 3. Gastric wall thickening with adjacent hyperdense fluid collection and perigastric fat stranding, may be associated to local inflammatory changes. No free air is seen, however gastric ideology is not completely excluded. 4. No evidence of pulmonary embolism. 5. Trace left pleural effusion. 6. Strandy opacities and consolidation at the lung bases, possible atelectasis or infiltrate 7. Remaining incidental findings as described above. Critical Value/emergent results were called by telephone at the time of interpretation on 07/19/2024 at 3:05 pm to provider [facility] , who verbally acknowledged these results. Electronically Signed   By: Leita Birmingham M.D.   On: 07/19/2024 15:12     .Critical Care  Performed by: Jerrol Agent, MD Authorized by: Jerrol Agent, MD   Critical care provider statement:    Critical care time (minutes):  124   Critical care was time spent personally by me on the following activities:  Development of treatment plan with patient or surrogate, discussions with consultants, evaluation of patient's response to treatment, examination of patient, ordering and review of laboratory studies, ordering and review of radiographic studies, ordering and performing treatments and interventions, pulse oximetry, re-evaluation of patient's condition and review of old charts   Care discussed with: accepting  provider at another facility      Medications Ordered in the ED  0.9 %  sodium chloride  infusion (Manually program via Guardrails IV Fluids) (has no administration in time range)  sodium chloride  0.9 % bolus 1,000 mL (1,000 mLs Intravenous New Bag/Given 07/19/24 1317)  ondansetron  (ZOFRAN ) injection 4 mg (4 mg Intravenous Given 07/19/24 1311)  fentaNYL  (SUBLIMAZE ) injection 50 mcg (50 mcg Intravenous Given 07/19/24 1312)  iohexol (OMNIPAQUE) 350 MG/ML injection 100 mL (100 mLs  Intravenous Contrast Given 07/19/24 1409)                                    Medical Decision Making Amount and/or Complexity of Data Reviewed Labs: ordered. Radiology: ordered.  Risk Prescription drug management.    63 year old male presenting to the emergency department with abdominal distention and swelling.  The patient states that he has had a feeling of fullness and pressure since earlier this week and he has not had a normal bowel movement since this past Thursday.  Thursday night he had an episode of intense nausea and chills.  He has been feeling extremely bloated with diffuse abdominal pain.  He denies any chest pain, has felt mild shortness of breath with associated abdominal swelling.  He takes blood pressure medications but did not take his medication today.  He has had decreased appetite and has only been tolerating p.o. liquids over the past several days.  He denies any active vomiting.  He is only moving small amounts of his bowels, had a very small stringy bowel movement earlier this morning, is passing gas.  On arrival, the patient was afebrile, tachycardic heart rate irregular, new onset atrial fibrillation with RVR noted on telemetry and on EKG, heart rates in the 150s on arrival.  Patient mildly tachypneic RR 22, hemodynamically stable otherwise blood pressure 151/127, saturating 95% on room air.  Patient on exam had diffuse abdominal tenderness to palpation with distention, mild guarding present.  Differential diagnose includes bowel obstruction, sepsis from intra-abdominal etiology, other acute intra-abdominal catastrophe or emergency.  IV access was obtained and the patient was administered an IV fluid bolus for volume resuscitation with improvement in his heart rates to the 130s.  Will hold on any Cardizem at this time with new onset A-fib with RVR due to potential concern for sepsis.  EKG: Send atrial fibrillation with RVR, ventricular rate 150, no acute ischemic  changes.  Labs: UA negative for UTI, BNP normal for age at 43, cardiac troponin 30, blood cultures collected and pending, CBC with a leukocytosis of 13.5, mild anemia to 10.9, CMP without significant abnormality, lipase normal.  CT abdomen pelvis and CTA PE study: IMPRESSION:  1. Large amount of complex free fluid in the abdomen and pelvis,  predominantly centered in the left upper quadrant concerning for  hemoperitoneum. An oval structure with a central hyperdense region  is seen at the splenic hilum measuring up to 4 cm, suggesting  splenic artery aneurysm with possible rupture. Interventional  radiology consultation is recommended.  2. Wedge-shaped defect in the posterior aspect of the spleen with  surrounding complex free fluid, differential considerations include  infarct, laceration if there is history of trauma, or morphologic  cleft.  3. Gastric wall thickening with adjacent hyperdense fluid collection  and perigastric fat stranding, may be associated to local  inflammatory changes. No free air is seen, however gastric ideology  is not completely excluded.  4. No evidence of pulmonary embolism.  5. Trace left pleural effusion.  6. Strandy opacities and consolidation at the lung bases, possible  atelectasis or infiltrate  7. Remaining incidental findings as described above.    Critical Value/emergent results were called by telephone at the time  of interpretation on 07/19/2024 at 3:05 pm to provider [facility]  , who verbally acknowledged these results.   IR and general surgery emergently consulted.  Spoke with Dr. Karalee of IR who will see the patient and provide further recommendations.  I spoke with Dr. Paola who recommended ER to ER transfer to [facility], did recommend providing emergent release blood.  We have 2 units available at this emergency department.  Patient was informed of his diagnosis.  Patient will be transferred ER to ER to [facility] emergently for  surgical management.  Dr. Scott and [facility] accepting.  [facility] contacted for transfer.  Patient administered emergency release blood, tachycardic but otherwise hemodynamically stable.  Confirmed he is a full code. Tachycardic but stable blood pressures, 2units PRBCs transfusing emergently at time of transfer. Pt and wife updated regarding diagnosis and plan of care.     Final diagnoses:  Hemoperitoneum  Atrial fibrillation with RVR [facility])    ED Discharge Orders     None          Jerrol Agent, MD 07/19/24 1545

## 2024-07-19 NOTE — ED Notes (Signed)
 Attempting blood draw after IV was inserted for first set of blood cultures. I was able to get blood for blue top culture bottle then IV site infiltrated. IV was d/c. Blue top culture bottle was taken to lab.

## 2024-07-20 DIAGNOSIS — I10 Essential (primary) hypertension: Secondary | ICD-10-CM

## 2024-07-20 LAB — IRON AND TIBC
Iron: 20 ug/dL — ABNORMAL LOW (ref 45–182)
Saturation Ratios: 7 % — ABNORMAL LOW (ref 17.9–39.5)
TIBC: 307 ug/dL (ref 250–450)
UIBC: 287 ug/dL

## 2024-07-20 LAB — BASIC METABOLIC PANEL WITH GFR
Anion gap: 9 (ref 5–15)
BUN: 11 mg/dL (ref 6–20)
CO2: 23 mmol/L (ref 22–32)
Calcium: 8.7 mg/dL — ABNORMAL LOW (ref 8.9–10.3)
Chloride: 102 mmol/L (ref 98–111)
Creatinine, Ser: 0.9 mg/dL (ref 0.61–1.24)
GFR, Estimated: >60 mL/min (ref >60)
Glucose, Bld: 116 mg/dL — ABNORMAL HIGH (ref 70–99)
Potassium: 3.9 mmol/L (ref 3.5–5.1)
Sodium: 134 mmol/L — ABNORMAL LOW (ref 135–145)

## 2024-07-20 LAB — FOLATE: Folate: 8.8 ng/mL (ref >5.9)

## 2024-07-20 LAB — CBC
HCT: 32.9 % — ABNORMAL LOW (ref 39.0–52.0)
Hemoglobin: 10.8 g/dL — ABNORMAL LOW (ref 13.0–17.0)
MCH: 28.9 pg (ref 26.0–34.0)
MCHC: 32.8 g/dL (ref 30.0–36.0)
MCV: 88 fL (ref 80.0–100.0)
Platelets: 229 K/uL (ref 150–400)
RBC: 3.74 MIL/uL — ABNORMAL LOW (ref 4.22–5.81)
RDW: 14.6 % (ref 11.5–15.5)
WBC: 9.9 K/uL (ref 4.0–10.5)
nRBC: 0 % (ref 0.0–0.2)

## 2024-07-20 LAB — RETICULOCYTES
Immature Retic Fract: 21.2 % — ABNORMAL HIGH (ref 2.3–15.9)
RBC.: 3.76 MIL/uL — ABNORMAL LOW (ref 4.22–5.81)
Retic Count, Absolute: 79 K/uL (ref 19.0–186.0)
Retic Ct Pct: 2.1 % (ref 0.4–3.1)

## 2024-07-20 LAB — HEPATIC FUNCTION PANEL
ALT: 15 U/L (ref 0–44)
AST: 16 U/L (ref 15–41)
Albumin: 3.2 g/dL — ABNORMAL LOW (ref 3.5–5.0)
Alkaline Phosphatase: 34 U/L — ABNORMAL LOW (ref 38–126)
Bilirubin, Direct: 0.4 mg/dL — ABNORMAL HIGH (ref 0.0–0.2)
Indirect Bilirubin: 1.2 mg/dL — ABNORMAL HIGH (ref 0.3–0.9)
Total Bilirubin: 1.6 mg/dL — ABNORMAL HIGH (ref 0.0–1.2)
Total Protein: 6.2 g/dL — ABNORMAL LOW (ref 6.5–8.1)

## 2024-07-20 LAB — HIV ANTIBODY (ROUTINE TESTING W REFLEX): HIV Screen 4th Generation wRfx: NONREACTIVE

## 2024-07-20 LAB — TSH: TSH: 2.074 u[IU]/mL (ref 0.350–4.500)

## 2024-07-20 LAB — MAGNESIUM: Magnesium: 2 mg/dL (ref 1.7–2.4)

## 2024-07-20 LAB — ABO/RH

## 2024-07-20 LAB — FERRITIN: Ferritin: 221 ng/mL (ref 24–336)

## 2024-07-20 LAB — VITAMIN B12: Vitamin B-12: 682 pg/mL (ref 180–914)

## 2024-07-20 MED ORDER — METOPROLOL TARTRATE 50 MG PO TABS
50.0000 mg | ORAL_TABLET | Freq: Two times a day (BID) | ORAL | Status: DC
  Administered 2024-07-20 – 2024-07-21 (×2): 50 mg via ORAL
  Filled 2024-07-20 (×2): qty 1

## 2024-07-20 MED ORDER — METOPROLOL TARTRATE 25 MG PO TABS
25.0000 mg | ORAL_TABLET | Freq: Once | ORAL | Status: AC
  Administered 2024-07-20: 25 mg via ORAL
  Filled 2024-07-20: qty 1

## 2024-07-20 NOTE — Progress Notes (Signed)
 PROGRESS NOTE  Marcus Scott FMW:[id number] DOB: [birth year]   PCP: Patient, No Pcp Per  Patient is from: Home.  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  In ED, in RVR to 150s.  Hgb 10.8.  WBC 13.5.  proBNP 832.  Troponin 30.  EKG confirmed A-fib with rate 150 and LAD.  CT concerning for ruptured splenic artery aneurysm.  Surgery, IR and PCCM consulted.  Rate control achieved after IV Lopressor with HR improving to 110s.  Patient underwent successful coil embolization of large splenic artery aneurysm.  Abdominal pain resolved remains in A-fib with mild RVR.  Cardiology consulted  Subjective: Seen and examined earlier this morning.  No major events overnight or this morning.  No major events overnight of this morning.  No complaints other than he is concerned about his heart rate and blood pressure.  Abdominal pain resolved but feels sore in right groin from access site.  Patient's wife at bedside.   Assessment and plan: Ruptured splenic artery aneurysm  - S/p coil embolization of large splenic artery aneurysm  - H&H stable at 10.8 but unknown baseline. - May need vaccination if coiling polarization leads to asplenia   New onset A-fib with RVR: HR in 90s at rest but as high as 140s with minimal activities.  CHA2DS2-VASc score 1 for HTN.  TSH normal. -Increase p.o. metoprolol to 50 mg twice daily -Follow echocardiogram -Cardiology consulted -Optimize electrolytes.   Essential hypertension: Slightly elevated. -Metoprolol as above -Will hold HCTZ and Benicar  Normocytic anemia: Unknown baseline.  Stable. - Monitor  Elevated troponin:: Mild.  Likely due to A-fib.  No  chest pain. - Follow echocardiogram - Manage A-fib as above.  Hyperbilirubinemia: Mild.  Could be due to #1 - Continue monitoring   There is no height or weight on file to calculate BMI.           DVT prophylaxis:  SCDs Start: 07/19/24 1952  Code Status: Full code Family Communication: Updated patient's wife at bedside Level of care: Progressive Status is: Inpatient Remains inpatient appropriate because: New onset A-fib with RVR, ruptured splenic aneurysm   Final disposition: Home   55 minutes with more than 50% spent in reviewing records, counseling patient/family and coordinating care.  Consultants:  General Surgery Interventional radiology Critical care Cardiology  Procedures: 10/18-coil embolization of large splenic artery aneurysm  Microbiology summarized: Blood cultures NGTD  Objective: Vitals:   07/20/24 0845 07/20/24 0850 07/20/24 0944 07/20/24 1229  BP:  (!) 156/107 (!) 129/93 (!) 147/98  Pulse:   98   Resp: (!) 22  13 18   Temp:   98.9 F (37.2 C) 98.3 F (36.8 C)  TempSrc:   Oral Oral  SpO2:   98% 91%    Examination:  GENERAL: No apparent distress.  Nontoxic. HEENT: MMM.  Vision and hearing grossly intact.  NECK: Supple.  No apparent JVD.  RESP:  No IWOB.  Fair aeration bilaterally. CVS: Irregular rhythm.  Rate in 90s at rest. Heart sounds normal.  ABD/GI/GU: BS+. Abd soft, NTND.  MSK/EXT:  Moves extremities. No apparent deformity. No edema.  Groin access site DCI. SKIN: no apparent skin lesion or wound NEURO: AA.  Oriented  appropriately.  No apparent focal neuro deficit. PSYCH: Calm. Normal affect.   Sch Meds:  Scheduled Meds:  metoprolol tartrate  50 mg Oral BID   sodium chloride  flush  3 mL Intravenous Q12H   Continuous Infusions: PRN Meds:.acetaminophen  **OR** acetaminophen , bisacodyl, HYDROmorphone (DILAUDID) injection, iohexol, methocarbamol (ROBAXIN) injection, oxyCODONE , prochlorperazine,  senna-docusate  Antimicrobials: Anti-infectives (From admission, onward)    None        I have personally reviewed the following labs and images: CBC: Recent Labs  Lab 07/19/24 1311 07/20/24 1001  WBC 13.5* 9.9  NEUTROABS 10.2*  --   HGB 10.9* 10.8*  HCT 32.2* 32.9*  MCV 84.7 88.0  PLT 326 229   BMP &GFR Recent Labs  Lab 07/19/24 1311 07/20/24 1001  NA 135 134*  K 3.6 3.9  CL 98 102  CO2 23 23  GLUCOSE 123* 116*  BUN 16 11  CREATININE 1.04 0.90  CALCIUM 9.8 8.7*  MG  --  2.0   CrCl cannot be calculated (Unknown ideal weight.). Liver & Pancreas: Recent Labs  Lab 07/19/24 1311 07/20/24 1001  AST 17 16  ALT 15 15  ALKPHOS 48 34*  BILITOT 1.3* 1.6*  PROT 7.1 6.2*  ALBUMIN 4.3 3.2*   Recent Labs  Lab 07/19/24 1311  LIPASE 21   No results for input(s): AMMONIA in the last 168 hours. Diabetic: No results for input(s): HGBA1C in the last 72 hours. No results for input(s): GLUCAP in the last 168 hours. Cardiac Enzymes: No results for input(s): CKTOTAL, CKMB, CKMBINDEX, TROPONINI in the last 168 hours. Recent Labs    07/19/24 1311  PROBNP 832.0*   Coagulation Profile: No results for input(s): INR, PROTIME in the last 168 hours. Thyroid Function Tests: Recent Labs    07/20/24 1001  TSH 2.074   Lipid Profile: No results for input(s): CHOL, HDL, LDLCALC, TRIG, CHOLHDL, LDLDIRECT in the last 72 hours. Anemia Panel: No results for input(s): VITAMINB12, FOLATE, FERRITIN, TIBC, IRON, RETICCTPCT in the last 72 hours. Urine analysis:    Component Value Date/Time   COLORURINE YELLOW 07/19/2024 1340   APPEARANCEUR CLEAR 07/19/2024 1340   LABSPEC 1.035 (H) 07/19/2024 1340   PHURINE 6.5 07/19/2024 1340   GLUCOSEU NEGATIVE 07/19/2024 1340   HGBUR NEGATIVE 07/19/2024 1340   BILIRUBINUR NEGATIVE 07/19/2024 1340   KETONESUR NEGATIVE 07/19/2024 1340   PROTEINUR NEGATIVE 07/19/2024 1340   NITRITE NEGATIVE  07/19/2024 1340   LEUKOCYTESUR NEGATIVE 07/19/2024 1340   Sepsis Labs: Invalid input(s): PROCALCITONIN, LACTICIDVEN  Microbiology: Recent Results (from the past 240 hours)  Blood culture (routine x 2)     Status: None (Preliminary result)   Collection Time: 07/19/24  1:50 PM   Specimen: BLOOD LEFT ARM  Result Value Ref Range Status   Specimen Description   Final    BLOOD LEFT ARM Performed at [facility], [address], [place], [place] [address]    Special Requests   Final    BOTTLES DRAWN AEROBIC ONLY Blood Culture results may not be optimal due to an inadequate volume of blood received in culture bottles Performed at [facility], [address], [place], [place] [address]    Culture   Final    NO GROWTH < 24 HOURS Performed at [facility], [address] [place], [place] [address]    Report Status PENDING  Incomplete    Radiology Studies: IR Angiogram Visceral Selective Result Date: 07/20/2024 INDICATION: 63 year old male [facility] emerg[facility] after suffering a sentinel bleed from a ruptured splenic artery aneurysm. Upon arrival, it was deemed that the patient was stable enough to undergo endovascular repair rather than emergent laparotomy and splenectomy. 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 EXAM: SELECTIVE VISCERAL ARTERIOGRAPHY; IR EMBO ART VEN HEMORR LYMPH EXTRAV INC GUIDE ROADMAPPING; ADDITIONAL ARTERIOGRAPHY; IR ULTRASOUND GUIDANCE VASC ACCESS RIGHT MEDICATIONS: Metoprolol was administered intravenously by the Radiology nurse in 5 mg aliquots until heart rate was normalized. A total of 10 mg was administered. Following the procedure, 30 mg of Toradol was administered intramuscular by the radiology nurse. ANESTHESIA/SEDATION: Moderate (conscious) sedation was  employed during this procedure. A total of Versed  4 mg and Fentanyl  200 mcg was administered intravenously. Moderate Sedation Time: 85 minutes. The patient's level of consciousness and vital signs were monitored continuously by radiology nursing throughout the procedure under my direct supervision. CONTRAST:  80mL OMNIPAQUE IOHEXOL 300 MG/ML  SOLN FLUOROSCOPY: Radiation Exposure Index (as provided by the fluoroscopic device): 2180 mGy Kerma COMPLICATIONS: None immediate. PROCEDURE: Informed consent was obtained from the patient following explanation of the procedure, risks, benefits and alternatives. The patient understands, agrees and consents for the procedure. All questions were addressed. A time out was performed prior to the initiation of the procedure. Maximal barrier sterile technique utilized including caps, mask, sterile gowns, sterile gloves, large sterile drape, hand hygiene, and Betadine prep. The right common femoral artery was interrogated with ultrasound and found to be widely patent. An image was obtained and stored for the medical record. Local anesthesia was attained by infiltration with 1% lidocaine . A small dermatotomy was made. Under real-time sonographic guidance, the vessel was punctured with a 21 gauge micropuncture needle. Using standard technique, the initial micro needle was exchanged over a 0.018 micro wire for a transitional 4 [place] micro sheath. The micro sheath was then exchanged over a 0.035 wire for a 5 [place] vascular sheath A C2 cobra catheter was advanced over a Bentson wire and used to select the celiac artery. A limited celiac arteriogram was performed. Partial filling of the large splenic artery aneurysm is visualized distally. The proximal pathway of the splenic artery was identified. Utilizing a Glidewire, the C2 cobra catheter was advanced further into the splenic artery. Arteriography was then performed in multiple obliquities. The main splenic artery bifurcates into 2  main branches. There is a large 2.7 x 2.2 x 2.3 cm aneurysm arising from 1 of the code dominant branches. No evidence of active bleeding at the time of arteriography. At this time, the decision was made to change 5 French catheter plaque forms. Therefore, the initial 65 cm cobra catheter was exchanged over a Rosen wire for a 100 cm angled glide catheter. The glide catheter was then further navigated over a Glidewire into the first of the splenic artery branches. Arteriography was performed. No aneurysm arising from this branch. There is excellent opacification of at least 30-50% of the splenic volume. The catheter was next advanced into the other branch of the splenic artery. 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 This branch supplies at least 50% of the splenic parenchyma. Unfortunately, given the anatomy of the aneurysm and the fact that it has already recently ruptured, preservation of this branch of the splenic artery is not possible. Therefore, a lantern microcatheter was introduced through the 5 [place] base catheter over a Fathom 16 wire. The  microcatheter was carefully advanced through the aneurysm and into the outflow artery. Coil embolization was then performed using a series of penumbra detachable microcoils. Once the outflow artery was successfully embolized, several framing coils were placed in the large aneurysm sac in order to attain stability and provide a scaffold to form coils in the inflow artery. The microcatheter was brought back to the neck of the aneurysm and additional coil embolization was performed of the inflow artery. A completely occlusive metal coil pack was successfully deployed. Follow-up angiography confirms no further filling of the aneurysm and successful preservation of the non aneurysmal branch of the splenic artery. The catheters were removed. The access site was closed with a Celt arterial closure device. IMPRESSION: 1. Large 2.7 cm aneurysm arising from  a distal branch of the splenic artery. 2. Successful coil embolization of the splenic artery. It was necessary to sacrifice the affected branch of the splenic artery which will likely result in a splenic infarction of between 30 and 50% of the overall splenic volume. PLAN: Please expect post embolization syndrome in the days following the procedure. The patient is likely to experience left upper quadrant pain, referred pain to the left scapula as well as malaise, leukocytosis and potentially low-grade fever. A reactive left-sided pleural effusion is also a possibility. These effects can be managed conservatively with NSAIDs, and a steroid taper if necessary as well as narcotic pain medications. Follow-up in IR clinic with repeat CT arteriogram of the abdomen and pelvis in January. Electronically Signed   By: Wilkie Lent M.D.   On: 07/20/2024 07:27   IR US  Guide Vasc Access Right Result Date: 07/20/2024 INDICATION: 63 year old male transferred emergently [facility] after suffering a sentinel bleed from a ruptured splenic artery aneurysm. Upon arrival, it was deemed that the patient was stable enough to undergo endovascular repair rather than emergent laparotomy and splenectomy. 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 EXAM: SELECTIVE VISCERAL ARTERIOGRAPHY; IR EMBO ART VEN HEMORR LYMPH EXTRAV INC GUIDE ROADMAPPING; ADDITIONAL ARTERIOGRAPHY; IR ULTRASOUND GUIDANCE VASC ACCESS RIGHT MEDICATIONS: Metoprolol was administered intravenously by the Radiology nurse in 5 mg aliquots until heart rate was normalized. A total of 10 mg was administered. Following the procedure, 30 mg of Toradol was administered intramuscular by the radiology nurse. ANESTHESIA/SEDATION: Moderate (conscious) sedation was employed during this procedure. A total of Versed  4 mg and Fentanyl  200 mcg  was administered intravenously. Moderate Sedation Time: 85 minutes. The patient's level of consciousness and vital signs were monitored continuously by radiology nursing throughout the procedure under my direct supervision. CONTRAST:  80mL OMNIPAQUE IOHEXOL 300 MG/ML  SOLN FLUOROSCOPY: Radiation Exposure Index (as provided by the fluoroscopic device): 2180 mGy Kerma COMPLICATIONS: None immediate. PROCEDURE: Informed consent was obtained from the patient following explanation of the procedure, risks, benefits and alternatives. The patient understands, agrees and consents for the procedure. All questions were addressed. A time out was performed prior to the initiation of the procedure. Maximal barrier sterile technique utilized including caps, mask, sterile gowns, sterile gloves, large sterile drape, hand hygiene, and Betadine prep. The right common femoral artery was interrogated with ultrasound and found to be widely patent. An image was obtained and stored for the medical record. Local anesthesia was attained by infiltration with 1% lidocaine . A small dermatotomy was made. Under real-time sonographic guidance, the vessel was punctured with  a 21 gauge micropuncture needle. Using standard technique, the initial micro needle was exchanged over a 0.018 micro wire for a transitional 4 [place] micro sheath. The micro sheath was then exchanged over a 0.035 wire for a 5 [place] vascular sheath A C2 cobra catheter was advanced over a Bentson wire and used to select the celiac artery. A limited celiac arteriogram was performed. Partial filling of the large splenic artery aneurysm is visualized distally. The proximal pathway of the splenic artery was identified. Utilizing a Glidewire, the C2 cobra catheter was advanced further into the splenic artery. Arteriography was then performed in multiple obliquities. The main splenic artery bifurcates into 2 main branches. There is a large 2.7 x 2.2 x 2.3 cm aneurysm arising from 1 of  the code dominant branches. No evidence of active bleeding at the time of arteriography. At this time, the decision was made to change 5 French catheter plaque forms. Therefore, the initial 65 cm cobra catheter was exchanged over a Rosen wire for a 100 cm angled glide catheter. The glide catheter was then further navigated over a Glidewire into the first of the splenic artery branches. Arteriography was performed. No aneurysm arising from this branch. There is excellent opacification of at least 30-50% of the splenic volume. The catheter was next advanced into the other branch of the splenic artery. 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 This branch supplies at least 50% of the splenic parenchyma. Unfortunately, given the anatomy of the aneurysm and the fact that it has already recently ruptured, preservation of this branch of the splenic artery is not possible. Therefore, a lantern microcatheter was introduced through the 5 [place] base catheter over a Fathom 16 wire. The microcatheter was carefully advanced through the aneurysm and into the outflow artery. Coil embolization was then performed using a series of penumbra detachable microcoils. Once the outflow artery was successfully embolized, several framing coils were placed in the large aneurysm sac in order to attain stability and provide a scaffold to form coils in the inflow artery. The microcatheter was brought back to the neck of the aneurysm and additional coil embolization was performed of the inflow artery. A completely occlusive metal coil pack was successfully deployed. Follow-up angiography confirms no further filling of the aneurysm and successful preservation of the non aneurysmal branch of the splenic artery. The catheters were removed. The access site was closed with a Celt arterial closure device. IMPRESSION: 1. 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 2. Successful coil embolization of the  splenic artery. It was necessary to sacrifice the affected branch of the splenic artery which will likely result in a splenic infarction of between 30 and 50% of the overall splenic volume. PLAN: Please expect post embolization syndrome in the days following the procedure. The patient is likely to experience left upper quadrant pain, referred pain to the left scapula as well as malaise, leukocytosis and potentially low-grade fever. A reactive left-sided pleural effusion is also a possibility. These effects can be managed conservatively with NSAIDs, and a steroid taper if necessary as well as narcotic pain medications. Follow-up in IR clinic with repeat CT arteriogram of the abdomen and pelvis in January. Electronically Signed   By: Wilkie Lent M.D.   On: 07/20/2024 07:27   IR Angiogram Selective Each Additional Vessel Result Date: 07/20/2024 INDICATION: 63 year old male transferred emergently from [facility] after suffering a sentinel bleed from a  ruptured splenic artery aneurysm. Upon arrival, it was deemed that the patient was stable enough to undergo endovascular repair rather than emergent laparotomy and splenectomy. 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 EXAM: SELECTIVE VISCERAL ARTERIOGRAPHY; IR EMBO ART VEN HEMORR LYMPH EXTRAV INC GUIDE ROADMAPPING; ADDITIONAL ARTERIOGRAPHY; IR ULTRASOUND GUIDANCE VASC ACCESS RIGHT MEDICATIONS: Metoprolol was administered intravenously by the Radiology nurse in 5 mg aliquots until heart rate was normalized. A total of 10 mg was administered. Following the procedure, 30 mg of Toradol was administered intramuscular by the radiology nurse. ANESTHESIA/SEDATION: Moderate (conscious) sedation was employed during this procedure. A total of Versed  4 mg and Fentanyl  200 mcg was administered intravenously. Moderate Sedation Time: 85  minutes. The patient's level of consciousness and vital signs were monitored continuously by radiology nursing throughout the procedure under my direct supervision. CONTRAST:  80mL OMNIPAQUE IOHEXOL 300 MG/ML  SOLN FLUOROSCOPY: Radiation Exposure Index (as provided by the fluoroscopic device): 2180 mGy Kerma COMPLICATIONS: None immediate. PROCEDURE: Informed consent was obtained from the patient following explanation of the procedure, risks, benefits and alternatives. The patient understands, agrees and consents for the procedure. All questions were addressed. A time out was performed prior to the initiation of the procedure. Maximal barrier sterile technique utilized including caps, mask, sterile gowns, sterile gloves, large sterile drape, hand hygiene, and Betadine prep. The right common femoral artery was interrogated with ultrasound and found to be widely patent. An image was obtained and stored for the medical record. Local anesthesia was attained by infiltration with 1% lidocaine . A small dermatotomy was made. Under real-time sonographic guidance, the vessel was punctured with a 21 gauge micropuncture needle. Using standard technique, the initial micro needle was exchanged over a 0.018 micro wire for a transitional 4 [place] micro sheath. The micro sheath was then exchanged over a 0.035 wire for a 5 [place] vascular sheath A C2 cobra catheter was advanced over a Bentson wire and used to select the celiac artery. A limited celiac arteriogram was performed. Partial filling of the large splenic artery aneurysm is visualized distally. The proximal pathway of the splenic artery was identified. Utilizing a Glidewire, the C2 cobra catheter was advanced further into the splenic artery. Arteriography was then performed in multiple obliquities. The main splenic artery bifurcates into 2 main branches. There is a large 2.7 x 2.2 x 2.3 cm aneurysm arising from 1 of the code dominant branches. No evidence of active bleeding  at the time of arteriography. At this time, the decision was made to change 5 French catheter plaque forms. Therefore, the initial 65 cm cobra catheter was exchanged over a Rosen wire for a 100 cm angled glide catheter. The glide catheter was then further navigated over a Glidewire into the first of the splenic artery branches. Arteriography was performed. No aneurysm arising from this branch. There is excellent opacification of at least 30-50% of the splenic volume. The catheter was next advanced into the other branch of the splenic artery. 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 This branch supplies at least 50% of the splenic parenchyma. Unfortunately, given the anatomy of the aneurysm and the fact that it has already recently ruptured, preservation of this branch of the splenic artery is not possible. Therefore, a lantern microcatheter was introduced through the 5 [place] base catheter over a Fathom 16 wire. The microcatheter was carefully advanced through the aneurysm and into the outflow artery.  Coil embolization was then performed using a series of penumbra detachable microcoils. Once the outflow artery was successfully embolized, several framing coils were placed in the large aneurysm sac in order to attain stability and provide a scaffold to form coils in the inflow artery. The microcatheter was brought back to the neck of the aneurysm and additional coil embolization was performed of the inflow artery. A completely occlusive metal coil pack was successfully deployed. Follow-up angiography confirms no further filling of the aneurysm and successful preservation of the non aneurysmal branch of the splenic artery. The catheters were removed. The access site was closed with a Celt arterial closure device. IMPRESSION: 1. 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 2. Successful coil embolization of the splenic artery. It was necessary to sacrifice the affected  branch of the splenic artery which will likely result in a splenic infarction of between 30 and 50% of the overall splenic volume. PLAN: Please expect post embolization syndrome in the days following the procedure. The patient is likely to experience left upper quadrant pain, referred pain to the left scapula as well as malaise, leukocytosis and potentially low-grade fever. A reactive left-sided pleural effusion is also a possibility. These effects can be managed conservatively with NSAIDs, and a steroid taper if necessary as well as narcotic pain medications. Follow-up in IR clinic with repeat CT arteriogram of the abdomen and pelvis in January. Electronically Signed   By: Wilkie Lent M.D.   On: 07/20/2024 07:27   IR Angiogram Selective Each Additional Vessel Result Date: 07/20/2024 INDICATION: 63 year old male transferred emergently from [facility] [place] to [facility] after suffering a sentinel bleed from a ruptured splenic artery aneurysm. Upon arrival, it was deemed that the patient was stable enough to undergo endovascular repair rather than emergent laparotomy and splenectomy. 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 EXAM: SELECTIVE VISCERAL ARTERIOGRAPHY; IR EMBO ART VEN HEMORR LYMPH EXTRAV INC GUIDE ROADMAPPING; ADDITIONAL ARTERIOGRAPHY; IR ULTRASOUND GUIDANCE VASC ACCESS RIGHT MEDICATIONS: Metoprolol was administered intravenously by the Radiology nurse in 5 mg aliquots until heart rate was normalized. A total of 10 mg was administered. Following the procedure, 30 mg of Toradol was administered intramuscular by the radiology nurse. ANESTHESIA/SEDATION: Moderate (conscious) sedation was employed during this procedure. A total of Versed  4 mg and Fentanyl  200 mcg was administered intravenously. Moderate Sedation Time: 85 minutes. The patient's level of consciousness and vital signs  were monitored continuously by radiology nursing throughout the procedure under my direct supervision. CONTRAST:  80mL OMNIPAQUE IOHEXOL 300 MG/ML  SOLN FLUOROSCOPY: Radiation Exposure Index (as provided by the fluoroscopic device): 2180 mGy Kerma COMPLICATIONS: None immediate. PROCEDURE: Informed consent was obtained from the patient following explanation of the procedure, risks, benefits and alternatives. The patient understands, agrees and consents for the procedure. All questions were addressed. A time out was performed prior to the initiation of the procedure. Maximal barrier sterile technique utilized including caps, mask, sterile gowns, sterile gloves, large sterile drape, hand hygiene, and Betadine prep. The right common femoral artery was interrogated with ultrasound and found to be widely patent. An image was obtained and stored for the medical record. Local anesthesia was attained by infiltration with 1% lidocaine . A small dermatotomy was made. Under real-time sonographic guidance, the vessel was punctured with a 21 gauge micropuncture needle. Using standard technique, the initial micro needle  was exchanged over a 0.018 micro wire for a transitional 4 [place] micro sheath. The micro sheath was then exchanged over a 0.035 wire for a 5 [place] vascular sheath A C2 cobra catheter was advanced over a Bentson wire and used to select the celiac artery. A limited celiac arteriogram was performed. Partial filling of the large splenic artery aneurysm is visualized distally. The proximal pathway of the splenic artery was identified. Utilizing a Glidewire, the C2 cobra catheter was advanced further into the splenic artery. Arteriography was then performed in multiple obliquities. The main splenic artery bifurcates into 2 main branches. There is a large 2.7 x 2.2 x 2.3 cm aneurysm arising from 1 of the code dominant branches. No evidence of active bleeding at the time of arteriography. At this time, the decision was  made to change 5 French catheter plaque forms. Therefore, the initial 65 cm cobra catheter was exchanged over a Rosen wire for a 100 cm angled glide catheter. The glide catheter was then further navigated over a Glidewire into the first of the splenic artery branches. Arteriography was performed. No aneurysm arising from this branch. There is excellent opacification of at least 30-50% of the splenic volume. The catheter was next advanced into the other branch of the splenic artery. 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 This branch supplies at least 50% of the splenic parenchyma. Unfortunately, given the anatomy of the aneurysm and the fact that it has already recently ruptured, preservation of this branch of the splenic artery is not possible. Therefore, a lantern microcatheter was introduced through the 5 [place] base catheter over a Fathom 16 wire. The microcatheter was carefully advanced through the aneurysm and into the outflow artery. Coil embolization was then performed using a series of penumbra detachable microcoils. Once the outflow artery was successfully embolized, several framing coils were placed in the large aneurysm sac in order to attain stability and provide a scaffold to form coils in the inflow artery. The microcatheter was brought back to the neck of the aneurysm and additional coil embolization was performed of the inflow artery. A completely occlusive metal coil pack was successfully deployed. Follow-up angiography confirms no further filling of the aneurysm and successful preservation of the non aneurysmal branch of the splenic artery. The catheters were removed. The access site was closed with a Celt arterial closure device. IMPRESSION: 1. 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 2. Successful coil embolization of the splenic artery. It was necessary to sacrifice the affected branch of the splenic artery which will likely result in a  splenic infarction of between 30 and 50% of the overall splenic volume. PLAN: Please expect post embolization syndrome in the days following the procedure. The patient is likely to experience left upper quadrant pain, referred pain to the left scapula as well as malaise, leukocytosis and potentially low-grade fever. A reactive left-sided pleural effusion is also a possibility. These effects can be managed conservatively with NSAIDs, and a steroid taper if necessary as well as narcotic pain medications. Follow-up in IR clinic with repeat CT arteriogram of the abdomen and pelvis in January. Electronically Signed   By: Wilkie Lent M.D.   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 Upon arrival,  it was deemed that the patient was stable enough to undergo endovascular repair rather than emergent laparotomy and splenectomy. 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 EXAM: SELECTIVE VISCERAL ARTERIOGRAPHY; IR EMBO ART VEN HEMORR LYMPH EXTRAV INC GUIDE ROADMAPPING; ADDITIONAL ARTERIOGRAPHY; IR ULTRASOUND GUIDANCE VASC ACCESS RIGHT MEDICATIONS: Metoprolol was administered intravenously by the Radiology nurse in 5 mg aliquots until heart rate was normalized. A total of 10 mg was administered. Following the procedure, 30 mg of Toradol was administered intramuscular by the radiology nurse. ANESTHESIA/SEDATION: Moderate (conscious) sedation was employed during this procedure. A total of Versed  4 mg and Fentanyl  200 mcg was administered intravenously. Moderate Sedation Time: 85 minutes. The patient's level of consciousness and vital signs were monitored continuously by radiology  nursing throughout the procedure under my direct supervision. CONTRAST:  80mL OMNIPAQUE IOHEXOL 300 MG/ML  SOLN FLUOROSCOPY: Radiation Exposure Index (as provided by the fluoroscopic device): 2180 mGy Kerma COMPLICATIONS: None immediate. PROCEDURE: Informed consent was obtained from the patient following explanation of the procedure, risks, benefits and alternatives. The patient understands, agrees and consents for the procedure. All questions were addressed. A time out was performed prior to the initiation of the procedure. Maximal barrier sterile technique utilized including caps, mask, sterile gowns, sterile gloves, large sterile drape, hand hygiene, and Betadine prep. The right common femoral artery was interrogated with ultrasound and found to be widely patent. An image was obtained and stored for the medical record. Local anesthesia was attained by infiltration with 1% lidocaine . A small dermatotomy was made. Under real-time sonographic guidance, the vessel was punctured with a 21 gauge micropuncture needle. Using standard technique, the initial micro needle was exchanged over a 0.018 micro wire for a transitional 4 [place] micro sheath. The micro sheath was then exchanged over a 0.035 wire for a 5 [place] vascular sheath A C2 cobra catheter was advanced over a Bentson wire and used to select the celiac artery. A limited celiac arteriogram was performed. Partial filling of the large splenic artery aneurysm is visualized distally. The proximal pathway of the splenic artery was identified. Utilizing a Glidewire, the C2 cobra catheter was advanced further into the splenic artery. Arteriography was then performed in multiple obliquities. The main splenic artery bifurcates into 2 main branches. There is a large 2.7 x 2.2 x 2.3 cm aneurysm arising from 1 of the code dominant branches. No evidence of active bleeding at the time of arteriography. At this time, the decision was made to change 5 French catheter plaque  forms. Therefore, the initial 65 cm cobra catheter was exchanged over a Rosen wire for a 100 cm angled glide catheter. The glide catheter was then further navigated over a Glidewire into the first of the splenic artery branches. Arteriography was performed. No aneurysm arising from this branch. There is excellent opacification of at least 30-50% of the splenic volume. The catheter was next advanced into the other branch of the splenic artery. 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 This branch supplies at least 50% of the splenic parenchyma. Unfortunately, given the anatomy of the aneurysm and the fact that it has already recently ruptured, preservation of this branch of the splenic artery is not possible. Therefore, a lantern microcatheter was introduced through the 5 [place] base catheter over a Fathom 16 wire. The microcatheter was carefully advanced through the aneurysm and into the outflow artery. Coil embolization was then performed using  a series of penumbra detachable microcoils. Once the outflow artery was successfully embolized, several framing coils were placed in the large aneurysm sac in order to attain stability and provide a scaffold to form coils in the inflow artery. The microcatheter was brought back to the neck of the aneurysm and additional coil embolization was performed of the inflow artery. A completely occlusive metal coil pack was successfully deployed. Follow-up angiography confirms no further filling of the aneurysm and successful preservation of the non aneurysmal branch of the splenic artery. The catheters were removed. The access site was closed with a Celt arterial closure device. IMPRESSION: 1. 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 2. Successful coil embolization of the splenic artery. It was necessary to sacrifice the affected branch of the splenic artery which will likely result in a splenic infarction of between 30 and 50%  of the overall splenic volume. PLAN: Please expect post embolization syndrome in the days following the procedure. The patient is likely to experience left upper quadrant pain, referred pain to the left scapula as well as malaise, leukocytosis and potentially low-grade fever. A reactive left-sided pleural effusion is also a possibility. These effects can be managed conservatively with NSAIDs, and a steroid taper if necessary as well as narcotic pain medications. Follow-up in IR clinic with repeat CT arteriogram of the abdomen and pelvis in January. Electronically Signed   By: Wilkie Lent M.D.   On: 07/20/2024 07:27   CT ABDOMEN PELVIS W CONTRAST Result Date: 07/19/2024 CLINICAL DATA:  Bowel obstruction suspected. Pulmonary embolism suspected, high probability. EXAM: CT ANGIOGRAPHY CHEST CT ABDOMEN AND PELVIS WITH CONTRAST TECHNIQUE: Multidetector CT imaging of the chest was performed using the standard protocol during bolus administration of intravenous contrast. Multiplanar CT image reconstructions and MIPs were obtained to evaluate the vascular anatomy. Multidetector CT imaging of the abdomen and pelvis was performed using the standard protocol during bolus administration of intravenous contrast. RADIATION DOSE REDUCTION: This exam was performed according to the departmental dose-optimization program which includes automated exposure control, adjustment of the mA and/or kV according to patient size and/or use of iterative reconstruction technique. CONTRAST:  100mL OMNIPAQUE IOHEXOL 350 MG/ML SOLN COMPARISON:  None Available. FINDINGS: CTA CHEST FINDINGS Cardiovascular: The heart is enlarged and no significant pericardial effusion is seen. Scattered coronary artery calcifications are noted. The aorta is normal in caliber. The pulmonary trunk is mildly distended which may be associated with underlying pulmonary artery hypertension. No definite pulmonary embolism is seen. Evaluation of the pulmonary  arteries is limited due to mixing artifact and respiratory motion. Mediastinum/Nodes: No mediastinal, hilar, or axillary lymphadenopathy by size criteria. The thyroid gland, trachea, and esophagus are within normal limits. Lungs/Pleura: There is a trace left pleural effusion. Multifocal strandy opacities and consolidation are seen at the lung bases bilaterally. No pneumothorax. Musculoskeletal: Degenerative changes are present in the thoracic spine. No acute osseous abnormality. Review of the MIP images confirms the above findings. CT ABDOMEN and PELVIS FINDINGS Hepatobiliary: No focal abnormality in the liver. No biliary ductal dilatation is seen. Multiple stones are present within the gallbladder. Pancreas: The pancreas is within normal limits. No pancreatic ductal dilatation. Spleen: Normal size. There is a wedge-shaped defect in the posterior aspect of the spleen with perisplenic complex free fluid extending into the left upper quadrant and about the greater curvature of the stomach. Adrenals/Urinary Tract: The adrenal glands are within normal limits. The kidneys enhance symmetrically. No renal calculus or hydronephrosis. The  bladder is unremarkable. Stomach/Bowel: The stomach is not well delineated due to a complex fluid collection adjacent to the greater curvature and fundus measuring 15.4 x 7.3 x 7.7 cm. There is mild gastric wall thickening with Peri grass trick fat stranding. There is no bowel obstruction, free air, or pneumatosis. Scattered diverticula are noted along the colon without evidence of diverticulitis. Appendix appears normal. Vascular/Lymphatic: There is a hyperdense structure at the splenic hilum measuring 2.5 x 1.7 cm with surrounding hypodense rim measuring 4.0 x 2.9 cm, query splenic artery aneurysm. Examination is limited due timing of contrast bolus. Aorta is normal in caliber. Reproductive: Prostate gland is enlarged. Other: Complex free fluid is noted in the abdomen and pelvis a, likely  originating in the left upper quadrant with attenuation most concerning for hemorrhage. A E fat containing umbilical hernia is present. There is a fat containing inguinal hernia on the left. Musculoskeletal: Degenerative changes are present in the lumbar spine. No acute fracture is seen. Review of the MIP images confirms the above findings. IMPRESSION: 1. Large amount of complex free fluid in the abdomen and pelvis, predominantly centered in the left upper quadrant concerning for hemoperitoneum. 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 Interventional radiology consultation is recommended. 2. Wedge-shaped defect in the posterior aspect of the spleen with surrounding complex free fluid, differential considerations include infarct, laceration if there is history of trauma, or morphologic cleft. 3. Gastric wall thickening with adjacent hyperdense fluid collection and perigastric fat stranding, may be associated to local inflammatory changes. No free air is seen, however gastric ideology is not completely excluded. 4. No evidence of pulmonary embolism. 5. Trace left pleural effusion. 6. Strandy opacities and consolidation at the lung bases, possible atelectasis or infiltrate 7. Remaining incidental findings as described above. Critical Value/emergent results were called by telephone at the time of interpretation on 07/19/2024 at 3:05 pm to provider [facility] , who verbally acknowledged these results. Electronically Signed   By: Leita Birmingham M.D.   On: 07/19/2024 15:12   CT Angio Chest PE W and/or Wo Contrast Result Date: 07/19/2024 CLINICAL DATA:  Bowel obstruction suspected. Pulmonary embolism suspected, high probability. EXAM: CT ANGIOGRAPHY CHEST CT ABDOMEN AND PELVIS WITH CONTRAST TECHNIQUE: Multidetector CT imaging of the chest was performed using the standard protocol during bolus  administration of intravenous contrast. Multiplanar CT image reconstructions and MIPs were obtained to evaluate the vascular anatomy. Multidetector CT imaging of the abdomen and pelvis was performed using the standard protocol during bolus administration of intravenous contrast. RADIATION DOSE REDUCTION: This exam was performed according to the departmental dose-optimization program which includes automated exposure control, adjustment of the mA and/or kV according to patient size and/or use of iterative reconstruction technique. CONTRAST:  100mL OMNIPAQUE IOHEXOL 350 MG/ML SOLN COMPARISON:  None Available. FINDINGS: CTA CHEST FINDINGS Cardiovascular: The heart is enlarged and no significant pericardial effusion is seen. Scattered coronary artery calcifications are noted. The aorta is normal in caliber. The pulmonary trunk is mildly distended which may be associated with underlying pulmonary artery hypertension. No definite pulmonary embolism is seen. Evaluation of the pulmonary arteries is limited due to mixing artifact and respiratory motion. Mediastinum/Nodes: No mediastinal, hilar, or axillary lymphadenopathy by size criteria. The thyroid gland, trachea, and esophagus are within normal limits. Lungs/Pleura: There is a trace left pleural effusion. Multifocal strandy opacities and consolidation are seen at the lung bases  bilaterally. No pneumothorax. Musculoskeletal: Degenerative changes are present in the thoracic spine. No acute osseous abnormality. Review of the MIP images confirms the above findings. CT ABDOMEN and PELVIS FINDINGS Hepatobiliary: No focal abnormality in the liver. No biliary ductal dilatation is seen. Multiple stones are present within the gallbladder. Pancreas: The pancreas is within normal limits. No pancreatic ductal dilatation. Spleen: Normal size. There is a wedge-shaped defect in the posterior aspect of the spleen with perisplenic complex free fluid extending into the left upper quadrant  and about the greater curvature of the stomach. Adrenals/Urinary Tract: The adrenal glands are within normal limits. The kidneys enhance symmetrically. No renal calculus or hydronephrosis. The bladder is unremarkable. Stomach/Bowel: The stomach is not well delineated due to a complex fluid collection adjacent to the greater curvature and fundus measuring 15.4 x 7.3 x 7.7 cm. There is mild gastric wall thickening with Peri grass trick fat stranding. There is no bowel obstruction, free air, or pneumatosis. Scattered diverticula are noted along the colon without evidence of diverticulitis. Appendix appears normal. Vascular/Lymphatic: There is a hyperdense structure at the splenic hilum measuring 2.5 x 1.7 cm with surrounding hypodense rim measuring 4.0 x 2.9 cm, query splenic artery aneurysm. Examination is limited due timing of contrast bolus. Aorta is normal in caliber. Reproductive: Prostate gland is enlarged. Other: Complex free fluid is noted in the abdomen and pelvis a, likely originating in the left upper quadrant with attenuation most concerning for hemorrhage. A E fat containing umbilical hernia is present. There is a fat containing inguinal hernia on the left. Musculoskeletal: Degenerative changes are present in the lumbar spine. No acute fracture is seen. Review of the MIP images confirms the above findings. IMPRESSION: 1. Large amount of complex free fluid in the abdomen and pelvis, predominantly centered in the left upper quadrant concerning for hemoperitoneum. 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 Interventional radiology consultation is recommended. 2. Wedge-shaped defect in the posterior aspect of the spleen with surrounding complex free fluid, differential considerations include infarct, laceration if there is history of trauma, or morphologic cleft. 3. Gastric wall thickening with  adjacent hyperdense fluid collection and perigastric fat stranding, may be associated to local inflammatory changes. No free air is seen, however gastric ideology is not completely excluded. 4. No evidence of pulmonary embolism. 5. Trace left pleural effusion. 6. Strandy opacities and consolidation at the lung bases, possible atelectasis or infiltrate 7. Remaining incidental findings as described above. Critical Value/emergent results were called by telephone at the time of interpretation on 07/19/2024 at 3:05 pm to provider [facility] , who verbally acknowledged these results. Electronically Signed   By: Leita Birmingham M.D.   On: 07/19/2024 15:12      Demetre Monaco T. Sol Engl[facility]  If 7PM-7AM, please contact night-coverage [email or web address] 07/20/2024, 1:04 PM

## 2024-07-20 NOTE — Plan of Care (Signed)
  Problem: Nutrition: Goal: Adequate nutrition will be maintained Outcome: Progressing   Problem: Safety: Goal: Ability to remain free from injury will improve Outcome: Progressing   Problem: Education: Goal: Knowledge of disease or condition will improve Outcome: Progressing Goal: Understanding of medication regimen will improve Outcome: Progressing Goal: Individualized Educational Video(s) Outcome: Progressing   Problem: Education: Goal: Understanding of medication regimen will improve Outcome: Progressing

## 2024-07-20 NOTE — Progress Notes (Signed)
 Referring Physician(s): Dr. Dreama Hanger  Supervising Physician: Karalee Beat  Patient Status:  [facility] - In-pt  Chief Complaint: Splenic artery aneurysm  Subjective: Feels well. Denies pain.  States he still fills fullness in his lower abdomen.  No constellation of post-embolic syndrome.  Warned him this may come later.  Ongoing work-up for new arrhythmia.  Hgb stable at 10.8 after 2u PRBC overnight  Allergies: Patient has no known allergies.  Medications: Prior to Admission medications   Medication Sig Start Date End Date Taking? Authorizing Provider  ascorbic acid (VITAMIN C) 500 MG tablet Take 500 mg by mouth daily.    [provider]  cephALEXin  (KEFLEX ) 500 MG capsule Take 1 capsule (500 mg total) by mouth 3 (three) times daily. 01/19/24   Patt Alm Macho, MD  oxyCODONE  (ROXICODONE ) 5 MG immediate release tablet Take 1 tablet (5 mg total) by mouth every 6 (six) hours as needed for severe pain (pain score 7-10). 01/24/24   Agarwala, Anshul, MD  oxyCODONE -acetaminophen  (PERCOCET) 5-325 MG tablet Take 1 tablet by mouth every 6 (six) hours as needed. 01/19/24   Patt Alm Macho, MD  Vitamin D, Cholecalciferol, 25 MCG (1000 UT) CAPS Take by mouth.    [provider]  zinc gluconate 50 MG tablet Take 50 mg by mouth daily.    [provider]     Vital Signs: BP (!) 129/93 (BP Location: Left Arm)   Pulse 98   Temp 98.9 F (37.2 C) (Oral)   Resp 13   SpO2 98%   Physical Exam NAD, alert, conversant. Abdomen: soft, non-tender.   Skin: R CFA procedure site intact.  Dressing clean and dry.  No evidence of hematoma or pseudoaneurysm.  Pulses: DP intact and palpable.  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 Upon arrival, it  was deemed that the patient was stable enough to undergo endovascular repair rather than emergent laparotomy and splenectomy. 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 EXAM: SELECTIVE VISCERAL ARTERIOGRAPHY; IR EMBO ART VEN HEMORR LYMPH EXTRAV INC GUIDE ROADMAPPING; ADDITIONAL ARTERIOGRAPHY; IR ULTRASOUND GUIDANCE VASC ACCESS RIGHT MEDICATIONS: Metoprolol was administered intravenously by the Radiology nurse in 5 mg aliquots until heart rate was normalized. A total of 10 mg was administered. Following the procedure, 30 mg of Toradol was administered intramuscular by the radiology nurse. ANESTHESIA/SEDATION: Moderate (conscious) sedation was employed during this procedure. A total of Versed  4 mg and Fentanyl  200 mcg was administered intravenously. Moderate Sedation Time: 85 minutes. The patient's level of consciousness and vital signs were monitored continuously by radiology nursing throughout the procedure under my direct supervision. CONTRAST:  80mL OMNIPAQUE IOHEXOL 300 MG/ML  SOLN FLUOROSCOPY: Radiation Exposure Index (as provided by the fluoroscopic device): 2180 mGy Kerma COMPLICATIONS: None immediate. PROCEDURE: Informed consent was obtained from the patient following explanation of the procedure, risks, benefits and alternatives. The patient understands, agrees and consents for the procedure. All questions were addressed. A time out was performed prior to the initiation of the procedure. Maximal barrier sterile technique utilized including caps, mask, sterile gowns, sterile gloves, large sterile drape, hand hygiene, and Betadine prep. The right common femoral artery was interrogated with ultrasound and found to be widely patent. An image was obtained and stored  for the medical record. Local anesthesia was attained by infiltration with 1% lidocaine . A small dermatotomy was made. Under real-time sonographic guidance, the  vessel was punctured with a 21 gauge micropuncture needle. Using standard technique, the initial micro needle was exchanged over a 0.018 micro wire for a transitional 4 [place] micro sheath. The micro sheath was then exchanged over a 0.035 wire for a 5 [place] vascular sheath A C2 cobra catheter was advanced over a Bentson wire and used to select the celiac artery. A limited celiac arteriogram was performed. Partial filling of the large splenic artery aneurysm is visualized distally. The proximal pathway of the splenic artery was identified. Utilizing a Glidewire, the C2 cobra catheter was advanced further into the splenic artery. Arteriography was then performed in multiple obliquities. The main splenic artery bifurcates into 2 main branches. There is a large 2.7 x 2.2 x 2.3 cm aneurysm arising from 1 of the code dominant branches. No evidence of active bleeding at the time of arteriography. At this time, the decision was made to change 5 French catheter plaque forms. Therefore, the initial 65 cm cobra catheter was exchanged over a Rosen wire for a 100 cm angled glide catheter. The glide catheter was then further navigated over a Glidewire into the first of the splenic artery branches. Arteriography was performed. No aneurysm arising from this branch. There is excellent opacification of at least 30-50% of the splenic volume. The catheter was next advanced into the other branch of the splenic artery. 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 This branch supplies at least 50% of the splenic parenchyma. Unfortunately, given the anatomy of the aneurysm and the fact that it has already recently ruptured, preservation of this branch of the splenic artery is not possible. Therefore, a lantern microcatheter was introduced through the 5 [place] base catheter over a Fathom 16 wire. The microcatheter was carefully advanced through the aneurysm and into the outflow artery. Coil embolization was then performed  using a series of penumbra detachable microcoils. Once the outflow artery was successfully embolized, several framing coils were placed in the large aneurysm sac in order to attain stability and provide a scaffold to form coils in the inflow artery. The microcatheter was brought back to the neck of the aneurysm and additional coil embolization was performed of the inflow artery. A completely occlusive metal coil pack was successfully deployed. Follow-up angiography confirms no further filling of the aneurysm and successful preservation of the non aneurysmal branch of the splenic artery. The catheters were removed. The access site was closed with a Celt arterial closure device. IMPRESSION: 1. 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 2. Successful coil embolization of the splenic artery. It was necessary to sacrifice the affected branch of the splenic artery which will likely result in a splenic infarction of between 30 and 50% of the overall splenic volume. PLAN: Please expect post embolization syndrome in the days following the procedure. The patient is likely to experience left upper quadrant pain, referred pain to the left scapula as well as malaise, leukocytosis and potentially low-grade fever. A reactive left-sided pleural effusion is also a possibility. These effects can be managed conservatively with NSAIDs, and a steroid taper if necessary as well as narcotic pain medications. Follow-up in IR clinic with repeat CT arteriogram of the abdomen and pelvis in January. Electronically Signed   By: Wilkie Lent M.D.   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 Upon arrival, it was deemed that the patient was stable enough to undergo endovascular  repair rather than emergent laparotomy and splenectomy. 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 EXAM: SELECTIVE VISCERAL ARTERIOGRAPHY; IR EMBO ART VEN HEMORR LYMPH EXTRAV INC GUIDE ROADMAPPING; ADDITIONAL ARTERIOGRAPHY; IR ULTRASOUND GUIDANCE VASC ACCESS RIGHT MEDICATIONS: Metoprolol was administered intravenously by the Radiology nurse in 5 mg aliquots until heart rate was normalized. A total of 10 mg was administered. Following the procedure, 30 mg of Toradol was administered intramuscular by the radiology nurse. ANESTHESIA/SEDATION: Moderate (conscious) sedation was employed during this procedure. A total of Versed  4 mg and Fentanyl  200 mcg was administered intravenously. Moderate Sedation Time: 85 minutes. The patient's level of consciousness and vital signs were monitored continuously by radiology nursing throughout the procedure under my direct supervision. CONTRAST:  80mL OMNIPAQUE IOHEXOL 300 MG/ML  SOLN FLUOROSCOPY: Radiation Exposure Index (as provided by the fluoroscopic device): 2180 mGy Kerma COMPLICATIONS: None immediate. PROCEDURE: Informed consent was obtained from the patient following explanation of the procedure, risks, benefits and alternatives. The patient understands, agrees and consents for the procedure. All questions were addressed. A time out was performed prior to the initiation of the procedure. Maximal barrier sterile technique utilized including caps, mask, sterile gowns, sterile gloves, large sterile drape, hand hygiene, and Betadine prep. The right common femoral artery was interrogated with ultrasound and found to be widely patent. An image was obtained and stored for the medical record. Local anesthesia was attained by infiltration with 1% lidocaine . A small dermatotomy was made. Under real-time sonographic guidance, the vessel was punctured with a 21 gauge micropuncture needle. Using standard  technique, the initial micro needle was exchanged over a 0.018 micro wire for a transitional 4 [place] micro sheath. The micro sheath was then exchanged over a 0.035 wire for a 5 [place] vascular sheath A C2 cobra catheter was advanced over a Bentson wire and used to select the celiac artery. A limited celiac arteriogram was performed. Partial filling of the large splenic artery aneurysm is visualized distally. The proximal pathway of the splenic artery was identified. Utilizing a Glidewire, the C2 cobra catheter was advanced further into the splenic artery. Arteriography was then performed in multiple obliquities. The main splenic artery bifurcates into 2 main branches. There is a large 2.7 x 2.2 x 2.3 cm aneurysm arising from 1 of the code dominant branches. No evidence of active bleeding at the time of arteriography. At this time, the decision was made to change 5 French catheter plaque forms. Therefore, the initial 65 cm cobra catheter was exchanged over a Rosen wire for a 100 cm angled glide catheter. The glide catheter was then further navigated over a Glidewire into the first of the splenic artery branches. Arteriography was performed. No aneurysm arising from this branch. There is excellent opacification of at least 30-50% of the splenic volume. The catheter was next advanced into the other branch of the splenic artery. 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 This branch supplies at least 50% of the splenic parenchyma. Unfortunately, given the anatomy of the aneurysm and the fact that it has already recently ruptured, preservation of this branch of the splenic artery is not possible. Therefore, a lantern  microcatheter was introduced through the 5 [place] base catheter over a Fathom 16 wire. The microcatheter was carefully advanced through the aneurysm and into the outflow artery. Coil embolization was then performed using a series of penumbra detachable microcoils. Once the outflow artery  was successfully embolized, several framing coils were placed in the large aneurysm sac in order to attain stability and provide a scaffold to form coils in the inflow artery. The microcatheter was brought back to the neck of the aneurysm and additional coil embolization was performed of the inflow artery. A completely occlusive metal coil pack was successfully deployed. Follow-up angiography confirms no further filling of the aneurysm and successful preservation of the non aneurysmal branch of the splenic artery. The catheters were removed. The access site was closed with a Celt arterial closure device. IMPRESSION: 1. 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 2. Successful coil embolization of the splenic artery. It was necessary to sacrifice the affected branch of the splenic artery which will likely result in a splenic infarction of between 30 and 50% of the overall splenic volume. PLAN: Please expect post embolization syndrome in the days following the procedure. The patient is likely to experience left upper quadrant pain, referred pain to the left scapula as well as malaise, leukocytosis and potentially low-grade fever. A reactive left-sided pleural effusion is also a possibility. These effects can be managed conservatively with NSAIDs, and a steroid taper if necessary as well as narcotic pain medications. Follow-up in IR clinic with repeat CT arteriogram of the abdomen and pelvis in January. Electronically Signed   By: Wilkie Lent M.D.   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 Upon arrival, it was deemed that the patient was stable enough to undergo endovascular repair rather than emergent laparotomy and splenectomy. In  addition to his splenic artery issue, he also has AFib with RVR heart rate in the 160s but is fairly asymptomatic from that. EXAM: SELECTIVE VISCERAL ARTERIOGRAPHY; IR EMBO ART VEN HEMORR LYMPH EXTRAV INC GUIDE ROADMAPPING; ADDITIONAL ARTERIOGRAPHY; IR ULTRASOUND GUIDANCE VASC ACCESS RIGHT MEDICATIONS: Metoprolol was administered intravenously by the Radiology nurse in 5 mg aliquots until heart rate was normalized. A total of 10 mg was administered. Following the procedure, 30 mg of Toradol was administered intramuscular by the radiology nurse. ANESTHESIA/SEDATION: Moderate (conscious) sedation was employed during this procedure. A total of Versed  4 mg and Fentanyl  200 mcg was administered intravenously. Moderate Sedation Time: 85 minutes. The patient's level of consciousness and vital signs were monitored continuously by radiology nursing throughout the procedure under my direct supervision. CONTRAST:  80mL OMNIPAQUE IOHEXOL 300 MG/ML  SOLN FLUOROSCOPY: Radiation Exposure Index (as provided by the fluoroscopic device): 2180 mGy Kerma COMPLICATIONS: None immediate. PROCEDURE: Informed consent was obtained from the patient following explanation of the procedure, risks, benefits and alternatives. The patient understands, agrees and consents for the procedure. All questions were addressed. A time out was performed prior to the initiation of the procedure. Maximal barrier sterile technique utilized including caps, mask, sterile gowns, sterile gloves, large sterile drape, hand hygiene, and Betadine prep. The right common femoral artery was interrogated with ultrasound and found to be widely patent. An image was obtained and stored for the medical record. Local anesthesia was attained by infiltration with 1%  lidocaine . A small dermatotomy was made. Under real-time sonographic guidance, the vessel was punctured with a 21 gauge micropuncture needle. Using standard technique, the initial micro needle was exchanged over a  0.018 micro wire for a transitional 4 [place] micro sheath. The micro sheath was then exchanged over a 0.035 wire for a 5 [place] vascular sheath A C2 cobra catheter was advanced over a Bentson wire and used to select the celiac artery. A limited celiac arteriogram was performed. Partial filling of the large splenic artery aneurysm is visualized distally. The proximal pathway of the splenic artery was identified. Utilizing a Glidewire, the C2 cobra catheter was advanced further into the splenic artery. Arteriography was then performed in multiple obliquities. The main splenic artery bifurcates into 2 main branches. There is a large 2.7 x 2.2 x 2.3 cm aneurysm arising from 1 of the code dominant branches. No evidence of active bleeding at the time of arteriography. At this time, the decision was made to change 5 French catheter plaque forms. Therefore, the initial 65 cm cobra catheter was exchanged over a Rosen wire for a 100 cm angled glide catheter. The glide catheter was then further navigated over a Glidewire into the first of the splenic artery branches. Arteriography was performed. No aneurysm arising from this branch. There is excellent opacification of at least 30-50% of the splenic volume. The catheter was next advanced into the other branch of the splenic artery. 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 This branch supplies at least 50% of the splenic parenchyma. Unfortunately, given the anatomy of the aneurysm and the fact that it has already recently ruptured, preservation of this branch of the splenic artery is not possible. Therefore, a lantern microcatheter was introduced through the 5 [place] base catheter over a Fathom 16 wire. The microcatheter was carefully advanced through the aneurysm and into the outflow artery. Coil embolization was then performed using a series of penumbra detachable microcoils. Once the outflow artery was successfully embolized, several framing coils were  placed in the large aneurysm sac in order to attain stability and provide a scaffold to form coils in the inflow artery. The microcatheter was brought back to the neck of the aneurysm and additional coil embolization was performed of the inflow artery. A completely occlusive metal coil pack was successfully deployed. Follow-up angiography confirms no further filling of the aneurysm and successful preservation of the non aneurysmal branch of the splenic artery. The catheters were removed. The access site was closed with a Celt arterial closure device. IMPRESSION: 1. 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 2. Successful coil embolization of the splenic artery. It was necessary to sacrifice the affected branch of the splenic artery which will likely result in a splenic infarction of between 30 and 50% of the overall splenic volume. PLAN: Please expect post embolization syndrome in the days following the procedure. The patient is likely to experience left upper quadrant pain, referred pain to the left scapula as well as malaise, leukocytosis and potentially low-grade fever. A reactive left-sided pleural effusion is also a possibility. These effects can be managed conservatively with NSAIDs, and a steroid taper if necessary as well as narcotic pain medications. Follow-up in IR clinic with repeat CT arteriogram of the abdomen and pelvis in January. Electronically Signed   By: Wilkie Lent M.D.   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 Upon arrival, it was deemed that the patient was stable enough to undergo endovascular repair rather than emergent laparotomy and splenectomy. In addition to his splenic artery issue, he also has AFib with  RVR heart rate in the 160s but is fairly asymptomatic from that. EXAM: SELECTIVE VISCERAL ARTERIOGRAPHY; IR EMBO ART VEN HEMORR LYMPH EXTRAV INC GUIDE ROADMAPPING; ADDITIONAL ARTERIOGRAPHY; IR ULTRASOUND GUIDANCE VASC ACCESS RIGHT MEDICATIONS: Metoprolol was administered intravenously by the Radiology nurse in 5 mg aliquots until heart rate was normalized. A total of 10 mg was administered. Following the procedure, 30 mg of Toradol was administered intramuscular by the radiology nurse. ANESTHESIA/SEDATION: Moderate (conscious) sedation was employed during this procedure. A total of Versed  4 mg and Fentanyl  200 mcg was administered intravenously. Moderate Sedation Time: 85 minutes. The patient's level of consciousness and vital signs were monitored continuously by radiology nursing throughout the procedure under my direct supervision. CONTRAST:  80mL OMNIPAQUE IOHEXOL 300 MG/ML  SOLN FLUOROSCOPY: Radiation Exposure Index (as provided by the fluoroscopic device): 2180 mGy Kerma COMPLICATIONS: None immediate. PROCEDURE: Informed consent was obtained from the patient following explanation of the procedure, risks, benefits and alternatives. The patient understands, agrees and consents for the procedure. All questions were addressed. A time out was performed prior to the initiation of the procedure. Maximal barrier sterile technique utilized including caps, mask, sterile gowns, sterile gloves, large sterile drape, hand hygiene, and Betadine prep. The right common femoral artery was interrogated with ultrasound and found to be widely patent. An image was obtained and stored for the medical record. Local anesthesia was attained by infiltration with 1% lidocaine . A small dermatotomy was made. Under real-time sonographic guidance, the vessel was punctured with a 21 gauge micropuncture needle. Using standard technique, the initial micro needle was exchanged over a 0.018 micro wire for a transitional 4 [place] micro sheath. The  micro sheath was then exchanged over a 0.035 wire for a 5 [place] vascular sheath A C2 cobra catheter was advanced over a Bentson wire and used to select the celiac artery. A limited celiac arteriogram was performed. Partial filling of the large splenic artery aneurysm is visualized distally. The proximal pathway of the splenic artery was identified. Utilizing a Glidewire, the C2 cobra catheter was advanced further into the splenic artery. Arteriography was then performed in multiple obliquities. The main splenic artery bifurcates into 2 main branches. There is a large 2.7 x 2.2 x 2.3 cm aneurysm arising from 1 of the code dominant branches. No evidence of active bleeding at the time of arteriography. At this time, the decision was made to change 5 French catheter plaque forms. Therefore, the initial 65 cm cobra catheter was exchanged over a Rosen wire for a 100 cm angled glide catheter. The glide catheter was then further navigated over a Glidewire into the first of the splenic artery branches. Arteriography was performed. No aneurysm arising from this branch. There is excellent opacification of at least 30-50% of the splenic volume. The catheter was next advanced into the other branch of the splenic artery. 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 This branch supplies at least 50% of the splenic parenchyma. Unfortunately, given the anatomy of the aneurysm and the fact that it has already recently ruptured, preservation of this branch of the splenic artery is not possible. Therefore, a lantern microcatheter was introduced through the 5 [place] base catheter over a Fathom  16 wire. The microcatheter was carefully advanced through the aneurysm and into the outflow artery. Coil embolization was then performed using a series of penumbra detachable microcoils. Once the outflow artery was successfully embolized, several framing coils were placed in the large aneurysm sac in order to attain stability  and provide a scaffold to form coils in the inflow artery. The microcatheter was brought back to the neck of the aneurysm and additional coil embolization was performed of the inflow artery. A completely occlusive metal coil pack was successfully deployed. Follow-up angiography confirms no further filling of the aneurysm and successful preservation of the non aneurysmal branch of the splenic artery. The catheters were removed. The access site was closed with a Celt arterial closure device. IMPRESSION: 1. 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 2. Successful coil embolization of the splenic artery. It was necessary to sacrifice the affected branch of the splenic artery which will likely result in a splenic infarction of between 30 and 50% of the overall splenic volume. PLAN: Please expect post embolization syndrome in the days following the procedure. The patient is likely to experience left upper quadrant pain, referred pain to the left scapula as well as malaise, leukocytosis and potentially low-grade fever. A reactive left-sided pleural effusion is also a possibility. These effects can be managed conservatively with NSAIDs, and a steroid taper if necessary as well as narcotic pain medications. Follow-up in IR clinic with repeat CT arteriogram of the abdomen and pelvis in January. Electronically Signed   By: Wilkie Lent M.D.   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 Upon arrival, it was deemed that the patient was stable enough to undergo endovascular repair rather than emergent laparotomy and splenectomy. In addition to his splenic artery issue, he also has AFib with RVR heart rate in the 160s but is fairly  asymptomatic from that. EXAM: SELECTIVE VISCERAL ARTERIOGRAPHY; IR EMBO ART VEN HEMORR LYMPH EXTRAV INC GUIDE ROADMAPPING; ADDITIONAL ARTERIOGRAPHY; IR ULTRASOUND GUIDANCE VASC ACCESS RIGHT MEDICATIONS: Metoprolol was administered intravenously by the Radiology nurse in 5 mg aliquots until heart rate was normalized. A total of 10 mg was administered. Following the procedure, 30 mg of Toradol was administered intramuscular by the radiology nurse. ANESTHESIA/SEDATION: Moderate (conscious) sedation was employed during this procedure. A total of Versed  4 mg and Fentanyl  200 mcg was administered intravenously. Moderate Sedation Time: 85 minutes. The patient's level of consciousness and vital signs were monitored continuously by radiology nursing throughout the procedure under my direct supervision. CONTRAST:  80mL OMNIPAQUE IOHEXOL 300 MG/ML  SOLN FLUOROSCOPY: Radiation Exposure Index (as provided by the fluoroscopic device): 2180 mGy Kerma COMPLICATIONS: None immediate. PROCEDURE: Informed consent was obtained from the patient following explanation of the procedure, risks, benefits and alternatives. The patient understands, agrees and consents for the procedure. All questions were addressed. A time out was performed prior to the initiation of the procedure. Maximal barrier sterile technique utilized including caps, mask, sterile gowns, sterile gloves, large sterile drape, hand hygiene, and Betadine prep. The right common femoral artery was interrogated with ultrasound and found to be widely patent. An image was obtained and stored for the medical record. Local anesthesia was attained by infiltration with 1% lidocaine . A small dermatotomy was made.  Under real-time sonographic guidance, the vessel was punctured with a 21 gauge micropuncture needle. Using standard technique, the initial micro needle was exchanged over a 0.018 micro wire for a transitional 4 [place] micro sheath. The micro sheath was then exchanged over a  0.035 wire for a 5 [place] vascular sheath A C2 cobra catheter was advanced over a Bentson wire and used to select the celiac artery. A limited celiac arteriogram was performed. Partial filling of the large splenic artery aneurysm is visualized distally. The proximal pathway of the splenic artery was identified. Utilizing a Glidewire, the C2 cobra catheter was advanced further into the splenic artery. Arteriography was then performed in multiple obliquities. The main splenic artery bifurcates into 2 main branches. There is a large 2.7 x 2.2 x 2.3 cm aneurysm arising from 1 of the code dominant branches. No evidence of active bleeding at the time of arteriography. At this time, the decision was made to change 5 French catheter plaque forms. Therefore, the initial 65 cm cobra catheter was exchanged over a Rosen wire for a 100 cm angled glide catheter. The glide catheter was then further navigated over a Glidewire into the first of the splenic artery branches. Arteriography was performed. No aneurysm arising from this branch. There is excellent opacification of at least 30-50% of the splenic volume. The catheter was next advanced into the other branch of the splenic artery. 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 This branch supplies at least 50% of the splenic parenchyma. Unfortunately, given the anatomy of the aneurysm and the fact that it has already recently ruptured, preservation of this branch of the splenic artery is not possible. Therefore, a lantern microcatheter was introduced through the 5 [place] base catheter over a Fathom 16 wire. The microcatheter was carefully advanced through the aneurysm and into the outflow artery. Coil embolization was then performed using a series of penumbra detachable microcoils. Once the outflow artery was successfully embolized, several framing coils were placed in the large aneurysm sac in order to attain stability and provide a scaffold to form coils in  the inflow artery. The microcatheter was brought back to the neck of the aneurysm and additional coil embolization was performed of the inflow artery. A completely occlusive metal coil pack was successfully deployed. Follow-up angiography confirms no further filling of the aneurysm and successful preservation of the non aneurysmal branch of the splenic artery. The catheters were removed. The access site was closed with a Celt arterial closure device. IMPRESSION: 1. 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 2. Successful coil embolization of the splenic artery. It was necessary to sacrifice the affected branch of the splenic artery which will likely result in a splenic infarction of between 30 and 50% of the overall splenic volume. PLAN: Please expect post embolization syndrome in the days following the procedure. The patient is likely to experience left upper quadrant pain, referred pain to the left scapula as well as malaise, leukocytosis and potentially low-grade fever. A reactive left-sided pleural effusion is also a possibility. These effects can be managed conservatively with NSAIDs, and a steroid taper if necessary as well as narcotic pain medications. Follow-up in IR clinic with repeat CT arteriogram of the abdomen and pelvis in January. Electronically Signed   By: Wilkie Lent M.D.   On: 07/20/2024 07:27   CT ABDOMEN PELVIS W CONTRAST Result Date: 07/19/2024 CLINICAL DATA:  Bowel obstruction suspected. Pulmonary embolism suspected, high probability. EXAM: CT ANGIOGRAPHY CHEST  CT ABDOMEN AND PELVIS WITH CONTRAST TECHNIQUE: Multidetector CT imaging of the chest was performed using the standard protocol during bolus administration of intravenous contrast. Multiplanar CT image reconstructions and MIPs were obtained to evaluate the vascular anatomy. Multidetector CT imaging of the abdomen and pelvis was performed using the standard protocol during bolus administration of  intravenous contrast. RADIATION DOSE REDUCTION: This exam was performed according to the departmental dose-optimization program which includes automated exposure control, adjustment of the mA and/or kV according to patient size and/or use of iterative reconstruction technique. CONTRAST:  100mL OMNIPAQUE IOHEXOL 350 MG/ML SOLN COMPARISON:  None Available. FINDINGS: CTA CHEST FINDINGS Cardiovascular: The heart is enlarged and no significant pericardial effusion is seen. Scattered coronary artery calcifications are noted. The aorta is normal in caliber. The pulmonary trunk is mildly distended which may be associated with underlying pulmonary artery hypertension. No definite pulmonary embolism is seen. Evaluation of the pulmonary arteries is limited due to mixing artifact and respiratory motion. Mediastinum/Nodes: No mediastinal, hilar, or axillary lymphadenopathy by size criteria. The thyroid gland, trachea, and esophagus are within normal limits. Lungs/Pleura: There is a trace left pleural effusion. Multifocal strandy opacities and consolidation are seen at the lung bases bilaterally. No pneumothorax. Musculoskeletal: Degenerative changes are present in the thoracic spine. No acute osseous abnormality. Review of the MIP images confirms the above findings. CT ABDOMEN and PELVIS FINDINGS Hepatobiliary: No focal abnormality in the liver. No biliary ductal dilatation is seen. Multiple stones are present within the gallbladder. Pancreas: The pancreas is within normal limits. No pancreatic ductal dilatation. Spleen: Normal size. There is a wedge-shaped defect in the posterior aspect of the spleen with perisplenic complex free fluid extending into the left upper quadrant and about the greater curvature of the stomach. Adrenals/Urinary Tract: The adrenal glands are within normal limits. The kidneys enhance symmetrically. No renal calculus or hydronephrosis. The bladder is unremarkable. Stomach/Bowel: The stomach is not well  delineated due to a complex fluid collection adjacent to the greater curvature and fundus measuring 15.4 x 7.3 x 7.7 cm. There is mild gastric wall thickening with Peri grass trick fat stranding. There is no bowel obstruction, free air, or pneumatosis. Scattered diverticula are noted along the colon without evidence of diverticulitis. Appendix appears normal. Vascular/Lymphatic: There is a hyperdense structure at the splenic hilum measuring 2.5 x 1.7 cm with surrounding hypodense rim measuring 4.0 x 2.9 cm, query splenic artery aneurysm. Examination is limited due timing of contrast bolus. Aorta is normal in caliber. Reproductive: Prostate gland is enlarged. Other: Complex free fluid is noted in the abdomen and pelvis a, likely originating in the left upper quadrant with attenuation most concerning for hemorrhage. A E fat containing umbilical hernia is present. There is a fat containing inguinal hernia on the left. Musculoskeletal: Degenerative changes are present in the lumbar spine. No acute fracture is seen. Review of the MIP images confirms the above findings. IMPRESSION: 1. Large amount of complex free fluid in the abdomen and pelvis, predominantly centered in the left upper quadrant concerning for hemoperitoneum. 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 Interventional radiology consultation is recommended. 2. Wedge-shaped defect in the posterior aspect of the spleen with surrounding complex free fluid, differential considerations include infarct, laceration if there is history of trauma, or morphologic cleft. 3. Gastric wall thickening with adjacent hyperdense fluid collection and perigastric fat stranding, may be associated to local  inflammatory changes. No free air is seen, however gastric ideology is not completely excluded. 4. No evidence of pulmonary embolism. 5. Trace left pleural  effusion. 6. Strandy opacities and consolidation at the lung bases, possible atelectasis or infiltrate 7. Remaining incidental findings as described above. Critical Value/emergent results were called by telephone at the time of interpretation on 07/19/2024 at 3:05 pm to provider [facility] , who verbally acknowledged these results. Electronically Signed   By: Leita Birmingham M.D.   On: 07/19/2024 15:12   CT Angio Chest PE W and/or Wo Contrast Result Date: 07/19/2024 CLINICAL DATA:  Bowel obstruction suspected. Pulmonary embolism suspected, high probability. EXAM: CT ANGIOGRAPHY CHEST CT ABDOMEN AND PELVIS WITH CONTRAST TECHNIQUE: Multidetector CT imaging of the chest was performed using the standard protocol during bolus administration of intravenous contrast. Multiplanar CT image reconstructions and MIPs were obtained to evaluate the vascular anatomy. Multidetector CT imaging of the abdomen and pelvis was performed using the standard protocol during bolus administration of intravenous contrast. RADIATION DOSE REDUCTION: This exam was performed according to the departmental dose-optimization program which includes automated exposure control, adjustment of the mA and/or kV according to patient size and/or use of iterative reconstruction technique. CONTRAST:  100mL OMNIPAQUE IOHEXOL 350 MG/ML SOLN COMPARISON:  None Available. FINDINGS: CTA CHEST FINDINGS Cardiovascular: The heart is enlarged and no significant pericardial effusion is seen. Scattered coronary artery calcifications are noted. The aorta is normal in caliber. The pulmonary trunk is mildly distended which may be associated with underlying pulmonary artery hypertension. No definite pulmonary embolism is seen. Evaluation of the pulmonary arteries is limited due to mixing artifact and respiratory motion. Mediastinum/Nodes: No mediastinal, hilar, or axillary lymphadenopathy by size criteria. The thyroid gland, trachea, and esophagus are within normal  limits. Lungs/Pleura: There is a trace left pleural effusion. Multifocal strandy opacities and consolidation are seen at the lung bases bilaterally. No pneumothorax. Musculoskeletal: Degenerative changes are present in the thoracic spine. No acute osseous abnormality. Review of the MIP images confirms the above findings. CT ABDOMEN and PELVIS FINDINGS Hepatobiliary: No focal abnormality in the liver. No biliary ductal dilatation is seen. Multiple stones are present within the gallbladder. Pancreas: The pancreas is within normal limits. No pancreatic ductal dilatation. Spleen: Normal size. There is a wedge-shaped defect in the posterior aspect of the spleen with perisplenic complex free fluid extending into the left upper quadrant and about the greater curvature of the stomach. Adrenals/Urinary Tract: The adrenal glands are within normal limits. The kidneys enhance symmetrically. No renal calculus or hydronephrosis. The bladder is unremarkable. Stomach/Bowel: The stomach is not well delineated due to a complex fluid collection adjacent to the greater curvature and fundus measuring 15.4 x 7.3 x 7.7 cm. There is mild gastric wall thickening with Peri grass trick fat stranding. There is no bowel obstruction, free air, or pneumatosis. Scattered diverticula are noted along the colon without evidence of diverticulitis. Appendix appears normal. Vascular/Lymphatic: There is a hyperdense structure at the splenic hilum measuring 2.5 x 1.7 cm with surrounding hypodense rim measuring 4.0 x 2.9 cm, query splenic artery aneurysm. Examination is limited due timing of contrast bolus. Aorta is normal in caliber. Reproductive: Prostate gland is enlarged. Other: Complex free fluid is noted in the abdomen and pelvis a, likely originating in the left upper quadrant with attenuation most concerning for hemorrhage. A E fat containing umbilical hernia is present. There is a fat containing inguinal hernia on the left. Musculoskeletal:  Degenerative changes are present in the lumbar spine.  No acute fracture is seen. Review of the MIP images confirms the above findings. IMPRESSION: 1. Large amount of complex free fluid in the abdomen and pelvis, predominantly centered in the left upper quadrant concerning for hemoperitoneum. 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 Interventional radiology consultation is recommended. 2. Wedge-shaped defect in the posterior aspect of the spleen with surrounding complex free fluid, differential considerations include infarct, laceration if there is history of trauma, or morphologic cleft. 3. Gastric wall thickening with adjacent hyperdense fluid collection and perigastric fat stranding, may be associated to local inflammatory changes. No free air is seen, however gastric ideology is not completely excluded. 4. No evidence of pulmonary embolism. 5. Trace left pleural effusion. 6. Strandy opacities and consolidation at the lung bases, possible atelectasis or infiltrate 7. Remaining incidental findings as described above. Critical Value/emergent results were called by telephone at the time of interpretation on 07/19/2024 at 3:05 pm to provider [facility] , who verbally acknowledged these results. Electronically Signed   By: Leita Birmingham M.D.   On: 07/19/2024 15:12    Labs:  CBC: Recent Labs    07/19/24 1311 07/20/24 1001  WBC 13.5* 9.9  HGB 10.9* 10.8*  HCT 32.2* 32.9*  PLT 326 229    COAGS: No results for input(s): INR, APTT in the last 8760 hours.  BMP: Recent Labs    07/19/24 1311  NA 135  K 3.6  CL 98  CO2 23  GLUCOSE 123*  BUN 16  CALCIUM 9.8  CREATININE 1.04  GFRNONAA >60    LIVER FUNCTION TESTS: Recent Labs    07/19/24 1311  BILITOT 1.3*  AST 17  ALT 15  ALKPHOS 48  PROT 7.1  ALBUMIN 4.3    Assessment and Plan: Hemoperitoneum secondary to  splenic artery aneurysm rupture s/p embolization by Dr. Karalee Patient stable without new or worsening abdominal pain this AM.  HgB stable at 10.8 after 2u PRBCs. DIscussed possibility of post-embolization syndrome with fatigue, malaise, flu-like symptoms-- however, he has avoided thus far.   No acute needs.  Procedure site intact.   Dressing can be removed tomorrow or as desired thereafter.   Electronically Signed: Liah Morr Sue-Ellen Naelle Diegel, PA 07/20/2024, 11:38 AM   I spent a total of 15 Minutes at the the patient's bedside AND on the patient's hospital floor or unit, greater than 50% of which was counseling/coordinating care for splenic artery aneurysm rupture.

## 2024-07-20 NOTE — Consult Note (Signed)
 Cardiology Consultation   Patient ID: Marcus Scott MRN: [id number]; DOB: 1960-11-10  Admit date: 07/19/2024 Date of Consult: 07/20/2024  PCP:  Patient, No Pcp Per   [facility] Providers Cardiologist:  None        Patient Profile: Marcus Scott is a 63 y.o. male with a hx of HTN and obesity who is being seen 07/20/2024 for the evaluation of atrial fib with a rvr/cvr at the request of Dr. Kathrin.  History of Present Illness: Marcus Scott was in his usual state of health until 3 days ago when he developed abdominal pain and swelling. He also notes palpitations. His pain worsened and he presented to [facility] for eval. 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 He has undergone coiling of his splenic artery and was found to have atrial fib and is referred for additional evaluation. His abdominal pain is much improved.    Past Medical History:  Diagnosis Date   Hypertension    STATES BP HAS BEEN HIGH FOR QUIET SOME TIME, NO MEDS, NO PCP    Past Surgical History:  Procedure Laterality Date   IR ANGIOGRAM SELECTIVE EACH ADDITIONAL VESSEL  07/19/2024   IR ANGIOGRAM SELECTIVE EACH ADDITIONAL VESSEL  07/19/2024   IR ANGIOGRAM VISCERAL SELECTIVE  07/19/2024   IR EMBO ART  VEN HEMORR LYMPH EXTRAV  INC GUIDE ROADMAPPING  07/19/2024   IR US  GUIDE VASC ACCESS RIGHT  07/19/2024   PATELLA RECONSTRUCTION Right 2021   at [facility]   REPAIR EXTENSOR TENDON Right 01/24/2024   Procedure: REPAIR, TENDON, EXTENSOR;  Surgeon: Marcus Buster, MD;  Location: [facility] OR;  Service: Orthopedics;  Laterality: Right;  RIGHT HAND WOUND EXPLORATION WITH EXTENSOR TENDON REPAIR     Home Medications:  Prior to Admission medications   Medication Sig Start Date End Date Taking? Authorizing Provider  hydrochlorothiazide (HYDRODIURIL) 12.5 MG tablet Take 12.5 mg by mouth daily. 04/01/24  Yes [provider]  olmesartan (BENICAR) 5 MG  tablet Take 10 mg by mouth daily. 04/13/24  Yes [provider]  ascorbic acid (VITAMIN C) 500 MG tablet Take 500 mg by mouth daily. Patient not taking: Reported on 07/20/2024    [provider]  meclizine (ANTIVERT) 25 MG tablet Take 25 mg by mouth 2 (two) times daily as needed for dizziness. Patient not taking: Reported on 07/20/2024 04/10/24   [provider]  oxyCODONE  (ROXICODONE ) 5 MG immediate release tablet Take 1 tablet (5 mg total) by mouth every 6 (six) hours as needed for severe pain (pain score 7-10). Patient not taking: Reported on 07/20/2024 01/24/24   Scott, Anshul, MD  oxyCODONE -acetaminophen  (PERCOCET) 5-325 MG tablet Take 1 tablet by mouth every 6 (six) hours as needed. Patient not taking: Reported on 07/20/2024 01/19/24   Marcus Alm Macho, MD  Vitamin D, Cholecalciferol, 25 MCG (1000 UT) CAPS Take by mouth. Patient not taking: Reported on 07/20/2024    [provider]  zinc gluconate 50 MG tablet Take 50 mg by mouth daily. Patient not taking: Reported on 07/20/2024    [provider]    Scheduled Meds:  metoprolol tartrate  50 mg Oral BID   sodium chloride  flush  3 mL Intravenous Q12H   Continuous Infusions:  PRN Meds: acetaminophen  **OR** acetaminophen , bisacodyl, HYDROmorphone (DILAUDID) injection, iohexol, methocarbamol (ROBAXIN) injection, oxyCODONE , prochlorperazine, senna-docusate  Allergies:   No Known Allergies  Social History:   Social History   Socioeconomic History  Marital status: Married    Spouse name: Not on file   Number of children: Not on file   Years of education: Not on file   Highest education level: Not on file  Occupational History   Not on file  Tobacco Use   Smoking status: Never   Smokeless tobacco: Never  Vaping Use   Vaping status: Never Used  Substance and Sexual Activity   Alcohol use: Not Currently   Drug use: Never   Sexual activity: Yes  Other Topics Concern   Not on file   Social History Narrative   Not on file   Social Drivers of Health   Financial Resource Strain: Not on file  Food Insecurity: No Food Insecurity (07/20/2024)   Hunger Vital Sign    Worried About Running Out of Food in the Last Year: Never true    Ran Out of Food in the Last Year: Never true  Transportation Needs: Patient Declined (07/20/2024)   PRAPARE - Administrator, Civil Service (Medical): Patient declined    Lack of Transportation (Non-Medical): Patient declined  Physical Activity: Not on file  Stress: Not on file  Social Connections: Not on file  Intimate Partner Violence: Patient Declined (07/20/2024)   Humiliation, Afraid, Rape, and Kick questionnaire    Fear of Current or Ex-Partner: Patient declined    Emotionally Abused: Patient declined    Physically Abused: Patient declined    Sexually Abused: Patient declined    Family History:   History reviewed. No pertinent family history.   ROS:  Please see the history of present illness.   All other ROS reviewed and negative.     Physical Exam/Data: Vitals:   07/20/24 0845 07/20/24 0850 07/20/24 0944 07/20/24 1229  BP:  (!) 156/107 (!) 129/93 (!) 147/98  Pulse:   98   Resp: (!) 22  13 18   Temp:   98.9 F (37.2 C) 98.3 F (36.8 C)  TempSrc:   Oral Oral  SpO2:   98% 91%    Intake/Output Summary (Last 24 hours) at 07/20/2024 1306 Last data filed at 07/20/2024 1200 Gross per 24 hour  Intake 1480.77 ml  Output 550 ml  Net 930.77 ml      01/24/2024   12:11 PM 01/23/2024    1:16 PM 01/22/2024   10:28 AM  Last 3 Weights  Weight (lbs) 266 lb 265 lb 6.9 oz 265 lb 6.9 oz  Weight (kg) 120.657 kg 120.4 kg 120.4 kg     There is no height or weight on file to calculate BMI.  General:  Well nourished, well developed, in no acute distres HEENT: normal Neck: no JVD Vascular: No carotid bruits; Distal pulses 2+ bilaterally Cardiac:  normal S1, S2; RRR; no murmur  Lungs:  clear to auscultation bilaterally,  no wheezing, rhonchi or rales  Abd: soft, nontender, no hepatomegaly  Ext: no edema Musculoskeletal:  No deformities, BUE and BLE strength normal and equal Skin: warm and dry  Neuro:  CNs 2-12 intact, no focal abnormalities noted Psych:  Normal affect   EKG:  The EKG was personally reviewed and demonstrates:  atrial fib with a controlled VR Telemetry:  Telemetry was personally reviewed and demonstrates:  atrial fib with a controlled VR  Relevant CV Studies: none  Laboratory Data: High Sensitivity Troponin:  No results for input(s): TROPONINIHS in the last 720 hours.   Chemistry Recent Labs  Lab 07/19/24 1311 07/20/24 1001  NA 135 134*  K 3.6 3.9  CL 98 102  CO2 23 23  GLUCOSE 123* 116*  BUN 16 11  CREATININE 1.04 0.90  CALCIUM 9.8 8.7*  MG  --  2.0  GFRNONAA >60 >60  ANIONGAP 14 9    Recent Labs  Lab 07/19/24 1311 07/20/24 1001  PROT 7.1 6.2*  ALBUMIN 4.3 3.2*  AST 17 16  ALT 15 15  ALKPHOS 48 34*  BILITOT 1.3* 1.6*   Lipids No results for input(s): CHOL, TRIG, HDL, LABVLDL, LDLCALC, CHOLHDL in the last 168 hours.  Hematology Recent Labs  Lab 07/19/24 1311 07/20/24 1001  WBC 13.5* 9.9  RBC 3.80* 3.74*  HGB 10.9* 10.8*  HCT 32.2* 32.9*  MCV 84.7 88.0  MCH 28.7 28.9  MCHC 33.9 32.8  RDW 13.9 14.6  PLT 326 229   Thyroid  Recent Labs  Lab 07/20/24 1001  TSH 2.074    BNP Recent Labs  Lab 07/19/24 1311  PROBNP 832.0*    DDimer No results for input(s): DDIMER in the last 168 hours.  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 Upon arrival, it was deemed that the patient was stable enough to undergo endovascular repair rather than emergent laparotomy and splenectomy. In addition to his splenic artery issue, he  also has AFib with RVR heart rate in the 160s but is fairly asymptomatic from that. EXAM: SELECTIVE VISCERAL ARTERIOGRAPHY; IR EMBO ART VEN HEMORR LYMPH EXTRAV INC GUIDE ROADMAPPING; ADDITIONAL ARTERIOGRAPHY; IR ULTRASOUND GUIDANCE VASC ACCESS RIGHT MEDICATIONS: Metoprolol was administered intravenously by the Radiology nurse in 5 mg aliquots until heart rate was normalized. A total of 10 mg was administered. Following the procedure, 30 mg of Toradol was administered intramuscular by the radiology nurse. ANESTHESIA/SEDATION: Moderate (conscious) sedation was employed during this procedure. A total of Versed  4 mg and Fentanyl  200 mcg was administered intravenously. Moderate Sedation Time: 85 minutes. The patient's level of consciousness and vital signs were monitored continuously by radiology nursing throughout the procedure under my direct supervision. CONTRAST:  80mL OMNIPAQUE IOHEXOL 300 MG/ML  SOLN FLUOROSCOPY: Radiation Exposure Index (as provided by the fluoroscopic device): 2180 mGy Kerma COMPLICATIONS: None immediate. PROCEDURE: Informed consent was obtained from the patient following explanation of the procedure, risks, benefits and alternatives. The patient understands, agrees and consents for the procedure. All questions were addressed. A time out was performed prior to the initiation of the procedure. Maximal barrier sterile technique utilized including caps, mask, sterile gowns, sterile gloves, large sterile drape, hand hygiene, and Betadine prep. The right common femoral artery was interrogated with ultrasound and found to be widely patent. An image was obtained and stored for the medical record. Local anesthesia was attained by infiltration with 1% lidocaine . A small dermatotomy was made. Under real-time sonographic guidance, the vessel was punctured with a 21 gauge micropuncture needle. Using standard technique, the initial micro needle was exchanged over a 0.018 micro wire for a transitional 4 [place]  micro sheath. The micro sheath was then exchanged over a 0.035 wire for a 5 [place] vascular sheath A C2 cobra catheter was advanced over a Bentson wire and used to select the celiac artery. A limited celiac arteriogram was performed. Partial filling of the large splenic artery aneurysm is visualized distally. The proximal pathway of the splenic artery was identified. Utilizing a Glidewire, the C2 cobra catheter was advanced further into  the splenic artery. Arteriography was then performed in multiple obliquities. The main splenic artery bifurcates into 2 main branches. There is a large 2.7 x 2.2 x 2.3 cm aneurysm arising from 1 of the code dominant branches. No evidence of active bleeding at the time of arteriography. At this time, the decision was made to change 5 French catheter plaque forms. Therefore, the initial 65 cm cobra catheter was exchanged over a Rosen wire for a 100 cm angled glide catheter. The glide catheter was then further navigated over a Glidewire into the first of the splenic artery branches. Arteriography was performed. No aneurysm arising from this branch. There is excellent opacification of at least 30-50% of the splenic volume. The catheter was next advanced into the other branch of the splenic artery. 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 This branch supplies at least 50% of the splenic parenchyma. Unfortunately, given the anatomy of the aneurysm and the fact that it has already recently ruptured, preservation of this branch of the splenic artery is not possible. Therefore, a lantern microcatheter was introduced through the 5 [place] base catheter over a Fathom 16 wire. The microcatheter was carefully advanced through the aneurysm and into the outflow artery. Coil embolization was then performed using a series of penumbra detachable microcoils. Once the outflow artery was successfully embolized, several framing coils were placed in the large aneurysm sac in order to  attain stability and provide a scaffold to form coils in the inflow artery. The microcatheter was brought back to the neck of the aneurysm and additional coil embolization was performed of the inflow artery. A completely occlusive metal coil pack was successfully deployed. Follow-up angiography confirms no further filling of the aneurysm and successful preservation of the non aneurysmal branch of the splenic artery. The catheters were removed. The access site was closed with a Celt arterial closure device. IMPRESSION: 1. 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 2. Successful coil embolization of the splenic artery. It was necessary to sacrifice the affected branch of the splenic artery which will likely result in a splenic infarction of between 30 and 50% of the overall splenic volume. PLAN: Please expect post embolization syndrome in the days following the procedure. The patient is likely to experience left upper quadrant pain, referred pain to the left scapula as well as malaise, leukocytosis and potentially low-grade fever. A reactive left-sided pleural effusion is also a possibility. These effects can be managed conservatively with NSAIDs, and a steroid taper if necessary as well as narcotic pain medications. Follow-up in IR clinic with repeat CT arteriogram of the abdomen and pelvis in January. Electronically Signed   By: Wilkie Lent M.D.   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 Upon arrival, it was deemed that the patient was stable enough to undergo endovascular repair rather than emergent laparotomy and splenectomy. In addition to his splenic artery issue, he also has AFib with RVR heart rate in the 160s but is fairly asymptomatic from  that. EXAM: SELECTIVE VISCERAL ARTERIOGRAPHY; IR EMBO ART VEN HEMORR LYMPH EXTRAV INC GUIDE ROADMAPPING; ADDITIONAL ARTERIOGRAPHY; IR ULTRASOUND GUIDANCE VASC ACCESS RIGHT MEDICATIONS: Metoprolol was administered intravenously by the Radiology nurse in 5 mg aliquots until heart rate was normalized. A total of 10 mg was administered.  Following the procedure, 30 mg of Toradol was administered intramuscular by the radiology nurse. ANESTHESIA/SEDATION: Moderate (conscious) sedation was employed during this procedure. A total of Versed  4 mg and Fentanyl  200 mcg was administered intravenously. Moderate Sedation Time: 85 minutes. The patient's level of consciousness and vital signs were monitored continuously by radiology nursing throughout the procedure under my direct supervision. CONTRAST:  80mL OMNIPAQUE IOHEXOL 300 MG/ML  SOLN FLUOROSCOPY: Radiation Exposure Index (as provided by the fluoroscopic device): 2180 mGy Kerma COMPLICATIONS: None immediate. PROCEDURE: Informed consent was obtained from the patient following explanation of the procedure, risks, benefits and alternatives. The patient understands, agrees and consents for the procedure. All questions were addressed. A time out was performed prior to the initiation of the procedure. Maximal barrier sterile technique utilized including caps, mask, sterile gowns, sterile gloves, large sterile drape, hand hygiene, and Betadine prep. The right common femoral artery was interrogated with ultrasound and found to be widely patent. An image was obtained and stored for the medical record. Local anesthesia was attained by infiltration with 1% lidocaine . A small dermatotomy was made. Under real-time sonographic guidance, the vessel was punctured with a 21 gauge micropuncture needle. Using standard technique, the initial micro needle was exchanged over a 0.018 micro wire for a transitional 4 [place] micro sheath. The micro sheath was then exchanged over a 0.035 wire for a 5  [place] vascular sheath A C2 cobra catheter was advanced over a Bentson wire and used to select the celiac artery. A limited celiac arteriogram was performed. Partial filling of the large splenic artery aneurysm is visualized distally. The proximal pathway of the splenic artery was identified. Utilizing a Glidewire, the C2 cobra catheter was advanced further into the splenic artery. Arteriography was then performed in multiple obliquities. The main splenic artery bifurcates into 2 main branches. There is a large 2.7 x 2.2 x 2.3 cm aneurysm arising from 1 of the code dominant branches. No evidence of active bleeding at the time of arteriography. At this time, the decision was made to change 5 French catheter plaque forms. Therefore, the initial 65 cm cobra catheter was exchanged over a Rosen wire for a 100 cm angled glide catheter. The glide catheter was then further navigated over a Glidewire into the first of the splenic artery branches. Arteriography was performed. No aneurysm arising from this branch. There is excellent opacification of at least 30-50% of the splenic volume. The catheter was next advanced into the other branch of the splenic artery. 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 This branch supplies at least 50% of the splenic parenchyma. Unfortunately, given the anatomy of the aneurysm and the fact that it has already recently ruptured, preservation of this branch of the splenic artery is not possible. Therefore, a lantern microcatheter was introduced through the 5 [place] base catheter over a Fathom 16 wire. The microcatheter was carefully advanced through the aneurysm and into the outflow artery. Coil embolization was then performed using a series of penumbra detachable microcoils. Once the outflow artery was successfully embolized, several framing coils were placed in the large aneurysm sac in order to attain stability and provide a scaffold to form coils in the inflow artery.  The microcatheter was brought back to the neck of the aneurysm and additional coil embolization was performed of the inflow artery. A completely occlusive metal coil pack was successfully deployed. Follow-up angiography confirms no further filling of the aneurysm and successful preservation of the non aneurysmal branch of the splenic artery.  The catheters were removed. The access site was closed with a Celt arterial closure device. IMPRESSION: 1. 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 2. Successful coil embolization of the splenic artery. It was necessary to sacrifice the affected branch of the splenic artery which will likely result in a splenic infarction of between 30 and 50% of the overall splenic volume. PLAN: Please expect post embolization syndrome in the days following the procedure. The patient is likely to experience left upper quadrant pain, referred pain to the left scapula as well as malaise, leukocytosis and potentially low-grade fever. A reactive left-sided pleural effusion is also a possibility. These effects can be managed conservatively with NSAIDs, and a steroid taper if necessary as well as narcotic pain medications. Follow-up in IR clinic with repeat CT arteriogram of the abdomen and pelvis in January. Electronically Signed   By: Wilkie Lent M.D.   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 Upon arrival, it was deemed that the patient was stable enough to undergo endovascular repair rather than emergent laparotomy and splenectomy. 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 EXAM: SELECTIVE VISCERAL ARTERIOGRAPHY; IR EMBO ART VEN  HEMORR LYMPH EXTRAV INC GUIDE ROADMAPPING; ADDITIONAL ARTERIOGRAPHY; IR ULTRASOUND GUIDANCE VASC ACCESS RIGHT MEDICATIONS: Metoprolol was administered intravenously by the Radiology nurse in 5 mg aliquots until heart rate was normalized. A total of 10 mg was administered. Following the procedure, 30 mg of Toradol was administered intramuscular by the radiology nurse. ANESTHESIA/SEDATION: Moderate (conscious) sedation was employed during this procedure. A total of Versed  4 mg and Fentanyl  200 mcg was administered intravenously. Moderate Sedation Time: 85 minutes. The patient's level of consciousness and vital signs were monitored continuously by radiology nursing throughout the procedure under my direct supervision. CONTRAST:  80mL OMNIPAQUE IOHEXOL 300 MG/ML  SOLN FLUOROSCOPY: Radiation Exposure Index (as provided by the fluoroscopic device): 2180 mGy Kerma COMPLICATIONS: None immediate. PROCEDURE: Informed consent was obtained from the patient following explanation of the procedure, risks, benefits and alternatives. The patient understands, agrees and consents for the procedure. All questions were addressed. A time out was performed prior to the initiation of the procedure. Maximal barrier sterile technique utilized including caps, mask, sterile gowns, sterile gloves, large sterile drape, hand hygiene, and Betadine prep. The right common femoral artery was interrogated with ultrasound and found to be widely patent. An image was obtained and stored for the medical record. Local anesthesia was attained by infiltration with 1% lidocaine . A small dermatotomy was made. Under real-time sonographic guidance, the vessel was punctured with a 21 gauge micropuncture needle. Using standard technique, the initial micro needle was exchanged over a 0.018 micro wire for a transitional 4 [place] micro sheath. The micro sheath was then exchanged over a 0.035 wire for a 5 [place] vascular sheath A C2 cobra catheter was advanced over a  Bentson wire and used to select the celiac artery. A limited celiac arteriogram was performed. Partial filling of the large splenic artery aneurysm is visualized distally. The proximal pathway of the splenic artery was identified. Utilizing a Glidewire, the C2 cobra catheter was advanced further into the splenic artery. Arteriography was then performed in multiple obliquities. The main  splenic artery bifurcates into 2 main branches. There is a large 2.7 x 2.2 x 2.3 cm aneurysm arising from 1 of the code dominant branches. No evidence of active bleeding at the time of arteriography. At this time, the decision was made to change 5 French catheter plaque forms. Therefore, the initial 65 cm cobra catheter was exchanged over a Rosen wire for a 100 cm angled glide catheter. The glide catheter was then further navigated over a Glidewire into the first of the splenic artery branches. Arteriography was performed. No aneurysm arising from this branch. There is excellent opacification of at least 30-50% of the splenic volume. The catheter was next advanced into the other branch of the splenic artery. 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 This branch supplies at least 50% of the splenic parenchyma. Unfortunately, given the anatomy of the aneurysm and the fact that it has already recently ruptured, preservation of this branch of the splenic artery is not possible. Therefore, a lantern microcatheter was introduced through the 5 [place] base catheter over a Fathom 16 wire. The microcatheter was carefully advanced through the aneurysm and into the outflow artery. Coil embolization was then performed using a series of penumbra detachable microcoils. Once the outflow artery was successfully embolized, several framing coils were placed in the large aneurysm sac in order to attain stability and provide a scaffold to form coils in the inflow artery. The microcatheter was brought back to the neck of the aneurysm  and additional coil embolization was performed of the inflow artery. A completely occlusive metal coil pack was successfully deployed. Follow-up angiography confirms no further filling of the aneurysm and successful preservation of the non aneurysmal branch of the splenic artery. The catheters were removed. The access site was closed with a Celt arterial closure device. IMPRESSION: 1. 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 2. Successful coil embolization of the splenic artery. It was necessary to sacrifice the affected branch of the splenic artery which will likely result in a splenic infarction of between 30 and 50% of the overall splenic volume. PLAN: Please expect post embolization syndrome in the days following the procedure. The patient is likely to experience left upper quadrant pain, referred pain to the left scapula as well as malaise, leukocytosis and potentially low-grade fever. A reactive left-sided pleural effusion is also a possibility. These effects can be managed conservatively with NSAIDs, and a steroid taper if necessary as well as narcotic pain medications. Follow-up in IR clinic with repeat CT arteriogram of the abdomen and pelvis in January. Electronically Signed   By: Wilkie Lent M.D.   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 Upon arrival, it was deemed that the patient was stable enough to undergo endovascular repair rather than emergent laparotomy and splenectomy. 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 EXAM: SELECTIVE VISCERAL ARTERIOGRAPHY; IR EMBO ART VEN HEMORR LYMPH EXTRAV INC GUIDE ROADMAPPING; ADDITIONAL  ARTERIOGRAPHY; IR ULTRASOUND GUIDANCE VASC ACCESS RIGHT MEDICATIONS: Metoprolol was administered intravenously by the Radiology nurse in 5 mg aliquots until heart rate was normalized. A total of 10 mg was administered. Following the procedure, 30 mg of Toradol was administered intramuscular by the  radiology nurse. ANESTHESIA/SEDATION: Moderate (conscious) sedation was employed during this procedure. A total of Versed  4 mg and Fentanyl  200 mcg was administered intravenously. Moderate Sedation Time: 85 minutes. The patient's level of consciousness and vital signs were monitored continuously by radiology nursing throughout the procedure under my direct supervision. CONTRAST:  80mL OMNIPAQUE IOHEXOL 300 MG/ML  SOLN FLUOROSCOPY: Radiation Exposure Index (as provided by the fluoroscopic device): 2180 mGy Kerma COMPLICATIONS: None immediate. PROCEDURE: Informed consent was obtained from the patient following explanation of the procedure, risks, benefits and alternatives. The patient understands, agrees and consents for the procedure. All questions were addressed. A time out was performed prior to the initiation of the procedure. Maximal barrier sterile technique utilized including caps, mask, sterile gowns, sterile gloves, large sterile drape, hand hygiene, and Betadine prep. The right common femoral artery was interrogated with ultrasound and found to be widely patent. An image was obtained and stored for the medical record. Local anesthesia was attained by infiltration with 1% lidocaine . A small dermatotomy was made. Under real-time sonographic guidance, the vessel was punctured with a 21 gauge micropuncture needle. Using standard technique, the initial micro needle was exchanged over a 0.018 micro wire for a transitional 4 [place] micro sheath. The micro sheath was then exchanged over a 0.035 wire for a 5 [place] vascular sheath A C2 cobra catheter was advanced over a Bentson wire and used to select the celiac artery. A  limited celiac arteriogram was performed. Partial filling of the large splenic artery aneurysm is visualized distally. The proximal pathway of the splenic artery was identified. Utilizing a Glidewire, the C2 cobra catheter was advanced further into the splenic artery. Arteriography was then performed in multiple obliquities. The main splenic artery bifurcates into 2 main branches. There is a large 2.7 x 2.2 x 2.3 cm aneurysm arising from 1 of the code dominant branches. No evidence of active bleeding at the time of arteriography. At this time, the decision was made to change 5 French catheter plaque forms. Therefore, the initial 65 cm cobra catheter was exchanged over a Rosen wire for a 100 cm angled glide catheter. The glide catheter was then further navigated over a Glidewire into the first of the splenic artery branches. Arteriography was performed. No aneurysm arising from this branch. There is excellent opacification of at least 30-50% of the splenic volume. The catheter was next advanced into the other branch of the splenic artery. 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 This branch supplies at least 50% of the splenic parenchyma. Unfortunately, given the anatomy of the aneurysm and the fact that it has already recently ruptured, preservation of this branch of the splenic artery is not possible. Therefore, a lantern microcatheter was introduced through the 5 [place] base catheter over a Fathom 16 wire. The microcatheter was carefully advanced through the aneurysm and into the outflow artery. Coil embolization was then performed using a series of penumbra detachable microcoils. Once the outflow artery was successfully embolized, several framing coils were placed in the large aneurysm sac in order to attain stability and provide a scaffold to form coils in the inflow artery. The microcatheter was brought back to the neck of the aneurysm and additional coil embolization was performed of the  inflow artery. A completely occlusive metal coil pack was successfully deployed. Follow-up angiography confirms no further filling of the aneurysm and successful preservation of the non aneurysmal branch of the splenic artery. The catheters were removed. The access site was closed with a Celt  arterial closure device. IMPRESSION: 1. 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 2. Successful coil embolization of the splenic artery. It was necessary to sacrifice the affected branch of the splenic artery which will likely result in a splenic infarction of between 30 and 50% of the overall splenic volume. PLAN: Please expect post embolization syndrome in the days following the procedure. The patient is likely to experience left upper quadrant pain, referred pain to the left scapula as well as malaise, leukocytosis and potentially low-grade fever. A reactive left-sided pleural effusion is also a possibility. These effects can be managed conservatively with NSAIDs, and a steroid taper if necessary as well as narcotic pain medications. Follow-up in IR clinic with repeat CT arteriogram of the abdomen and pelvis in January. Electronically Signed   By: Wilkie Lent M.D.   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 Upon arrival, it was deemed that the patient was stable enough to undergo endovascular repair rather than emergent laparotomy and splenectomy. 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 EXAM: SELECTIVE VISCERAL ARTERIOGRAPHY; IR EMBO ART VEN HEMORR LYMPH EXTRAV INC GUIDE ROADMAPPING; ADDITIONAL ARTERIOGRAPHY; IR ULTRASOUND GUIDANCE VASC  ACCESS RIGHT MEDICATIONS: Metoprolol was administered intravenously by the Radiology nurse in 5 mg aliquots until heart rate was normalized. A total of 10 mg was administered. Following the procedure, 30 mg of Toradol was administered intramuscular by the radiology nurse. ANESTHESIA/SEDATION: Moderate (conscious) sedation was employed during this procedure. A total of Versed  4 mg and Fentanyl  200 mcg was administered intravenously. Moderate Sedation Time: 85 minutes. The patient's level of consciousness and vital signs were monitored continuously by radiology nursing throughout the procedure under my direct supervision. CONTRAST:  80mL OMNIPAQUE IOHEXOL 300 MG/ML  SOLN FLUOROSCOPY: Radiation Exposure Index (as provided by the fluoroscopic device): 2180 mGy Kerma COMPLICATIONS: None immediate. PROCEDURE: Informed consent was obtained from the patient following explanation of the procedure, risks, benefits and alternatives. The patient understands, agrees and consents for the procedure. All questions were addressed. A time out was performed prior to the initiation of the procedure. Maximal barrier sterile technique utilized including caps, mask, sterile gowns, sterile gloves, large sterile drape, hand hygiene, and Betadine prep. The right common femoral artery was interrogated with ultrasound and found to be widely patent. An image was obtained and stored for the medical record. Local anesthesia was attained by infiltration with 1% lidocaine . A small dermatotomy was made. Under real-time sonographic guidance, the vessel was punctured with a 21 gauge micropuncture needle. Using standard technique, the initial micro needle was exchanged over a 0.018 micro wire for a transitional 4 [place] micro sheath. The micro sheath was then exchanged over a 0.035 wire for a 5 [place] vascular sheath A C2 cobra catheter was advanced over a Bentson wire and used to select the celiac artery. A limited celiac arteriogram was performed.  Partial filling of the large splenic artery aneurysm is visualized distally. The proximal pathway of the splenic artery was identified. Utilizing a Glidewire, the C2 cobra catheter was advanced further into the splenic artery. Arteriography was then performed in multiple obliquities. The main splenic artery bifurcates into 2 main  branches. There is a large 2.7 x 2.2 x 2.3 cm aneurysm arising from 1 of the code dominant branches. No evidence of active bleeding at the time of arteriography. At this time, the decision was made to change 5 French catheter plaque forms. Therefore, the initial 65 cm cobra catheter was exchanged over a Rosen wire for a 100 cm angled glide catheter. The glide catheter was then further navigated over a Glidewire into the first of the splenic artery branches. Arteriography was performed. No aneurysm arising from this branch. There is excellent opacification of at least 30-50% of the splenic volume. The catheter was next advanced into the other branch of the splenic artery. 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 This branch supplies at least 50% of the splenic parenchyma. Unfortunately, given the anatomy of the aneurysm and the fact that it has already recently ruptured, preservation of this branch of the splenic artery is not possible. Therefore, a lantern microcatheter was introduced through the 5 [place] base catheter over a Fathom 16 wire. The microcatheter was carefully advanced through the aneurysm and into the outflow artery. Coil embolization was then performed using a series of penumbra detachable microcoils. Once the outflow artery was successfully embolized, several framing coils were placed in the large aneurysm sac in order to attain stability and provide a scaffold to form coils in the inflow artery. The microcatheter was brought back to the neck of the aneurysm and additional coil embolization was performed of the inflow artery. A completely occlusive  metal coil pack was successfully deployed. Follow-up angiography confirms no further filling of the aneurysm and successful preservation of the non aneurysmal branch of the splenic artery. The catheters were removed. The access site was closed with a Celt arterial closure device. IMPRESSION: 1. 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 2. Successful coil embolization of the splenic artery. It was necessary to sacrifice the affected branch of the splenic artery which will likely result in a splenic infarction of between 30 and 50% of the overall splenic volume. PLAN: Please expect post embolization syndrome in the days following the procedure. The patient is likely to experience left upper quadrant pain, referred pain to the left scapula as well as malaise, leukocytosis and potentially low-grade fever. A reactive left-sided pleural effusion is also a possibility. These effects can be managed conservatively with NSAIDs, and a steroid taper if necessary as well as narcotic pain medications. Follow-up in IR clinic with repeat CT arteriogram of the abdomen and pelvis in January. Electronically Signed   By: Wilkie Lent M.D.   On: 07/20/2024 07:27   CT ABDOMEN PELVIS W CONTRAST Result Date: 07/19/2024 CLINICAL DATA:  Bowel obstruction suspected. Pulmonary embolism suspected, high probability. EXAM: CT ANGIOGRAPHY CHEST CT ABDOMEN AND PELVIS WITH CONTRAST TECHNIQUE: Multidetector CT imaging of the chest was performed using the standard protocol during bolus administration of intravenous contrast. Multiplanar CT image reconstructions and MIPs were obtained to evaluate the vascular anatomy. Multidetector CT imaging of the abdomen and pelvis was performed using the standard protocol during bolus administration of intravenous contrast. RADIATION DOSE REDUCTION: This exam was performed according to the departmental dose-optimization program which includes automated exposure control,  adjustment of the mA and/or kV according to patient size and/or use of iterative reconstruction technique. CONTRAST:  100mL OMNIPAQUE IOHEXOL 350 MG/ML SOLN COMPARISON:  None Available. FINDINGS: CTA CHEST FINDINGS Cardiovascular: The heart is enlarged and no significant pericardial effusion is seen. Scattered coronary artery  calcifications are noted. The aorta is normal in caliber. The pulmonary trunk is mildly distended which may be associated with underlying pulmonary artery hypertension. No definite pulmonary embolism is seen. Evaluation of the pulmonary arteries is limited due to mixing artifact and respiratory motion. Mediastinum/Nodes: No mediastinal, hilar, or axillary lymphadenopathy by size criteria. The thyroid gland, trachea, and esophagus are within normal limits. Lungs/Pleura: There is a trace left pleural effusion. Multifocal strandy opacities and consolidation are seen at the lung bases bilaterally. No pneumothorax. Musculoskeletal: Degenerative changes are present in the thoracic spine. No acute osseous abnormality. Review of the MIP images confirms the above findings. CT ABDOMEN and PELVIS FINDINGS Hepatobiliary: No focal abnormality in the liver. No biliary ductal dilatation is seen. Multiple stones are present within the gallbladder. Pancreas: The pancreas is within normal limits. No pancreatic ductal dilatation. Spleen: Normal size. There is a wedge-shaped defect in the posterior aspect of the spleen with perisplenic complex free fluid extending into the left upper quadrant and about the greater curvature of the stomach. Adrenals/Urinary Tract: The adrenal glands are within normal limits. The kidneys enhance symmetrically. No renal calculus or hydronephrosis. The bladder is unremarkable. Stomach/Bowel: The stomach is not well delineated due to a complex fluid collection adjacent to the greater curvature and fundus measuring 15.4 x 7.3 x 7.7 cm. There is mild gastric wall thickening with Peri  grass trick fat stranding. There is no bowel obstruction, free air, or pneumatosis. Scattered diverticula are noted along the colon without evidence of diverticulitis. Appendix appears normal. Vascular/Lymphatic: There is a hyperdense structure at the splenic hilum measuring 2.5 x 1.7 cm with surrounding hypodense rim measuring 4.0 x 2.9 cm, query splenic artery aneurysm. Examination is limited due timing of contrast bolus. Aorta is normal in caliber. Reproductive: Prostate gland is enlarged. Other: Complex free fluid is noted in the abdomen and pelvis a, likely originating in the left upper quadrant with attenuation most concerning for hemorrhage. A E fat containing umbilical hernia is present. There is a fat containing inguinal hernia on the left. Musculoskeletal: Degenerative changes are present in the lumbar spine. No acute fracture is seen. Review of the MIP images confirms the above findings. IMPRESSION: 1. Large amount of complex free fluid in the abdomen and pelvis, predominantly centered in the left upper quadrant concerning for hemoperitoneum. 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 Interventional radiology consultation is recommended. 2. Wedge-shaped defect in the posterior aspect of the spleen with surrounding complex free fluid, differential considerations include infarct, laceration if there is history of trauma, or morphologic cleft. 3. Gastric wall thickening with adjacent hyperdense fluid collection and perigastric fat stranding, may be associated to local inflammatory changes. No free air is seen, however gastric ideology is not completely excluded. 4. No evidence of pulmonary embolism. 5. Trace left pleural effusion. 6. Strandy opacities and consolidation at the lung bases, possible atelectasis or infiltrate 7. Remaining incidental findings as described above. Critical  Value/emergent results were called by telephone at the time of interpretation on 07/19/2024 at 3:05 pm to provider [facility] , who verbally acknowledged these results. Electronically Signed   By: Leita Birmingham M.D.   On: 07/19/2024 15:12   CT Angio Chest PE W and/or Wo Contrast Result Date: 07/19/2024 CLINICAL DATA:  Bowel obstruction suspected. Pulmonary embolism suspected, high probability. EXAM: CT ANGIOGRAPHY CHEST CT ABDOMEN AND PELVIS WITH CONTRAST TECHNIQUE: Multidetector CT  imaging of the chest was performed using the standard protocol during bolus administration of intravenous contrast. Multiplanar CT image reconstructions and MIPs were obtained to evaluate the vascular anatomy. Multidetector CT imaging of the abdomen and pelvis was performed using the standard protocol during bolus administration of intravenous contrast. RADIATION DOSE REDUCTION: This exam was performed according to the departmental dose-optimization program which includes automated exposure control, adjustment of the mA and/or kV according to patient size and/or use of iterative reconstruction technique. CONTRAST:  100mL OMNIPAQUE IOHEXOL 350 MG/ML SOLN COMPARISON:  None Available. FINDINGS: CTA CHEST FINDINGS Cardiovascular: The heart is enlarged and no significant pericardial effusion is seen. Scattered coronary artery calcifications are noted. The aorta is normal in caliber. The pulmonary trunk is mildly distended which may be associated with underlying pulmonary artery hypertension. No definite pulmonary embolism is seen. Evaluation of the pulmonary arteries is limited due to mixing artifact and respiratory motion. Mediastinum/Nodes: No mediastinal, hilar, or axillary lymphadenopathy by size criteria. The thyroid gland, trachea, and esophagus are within normal limits. Lungs/Pleura: There is a trace left pleural effusion. Multifocal strandy opacities and consolidation are seen at the lung bases bilaterally. No pneumothorax.  Musculoskeletal: Degenerative changes are present in the thoracic spine. No acute osseous abnormality. Review of the MIP images confirms the above findings. CT ABDOMEN and PELVIS FINDINGS Hepatobiliary: No focal abnormality in the liver. No biliary ductal dilatation is seen. Multiple stones are present within the gallbladder. Pancreas: The pancreas is within normal limits. No pancreatic ductal dilatation. Spleen: Normal size. There is a wedge-shaped defect in the posterior aspect of the spleen with perisplenic complex free fluid extending into the left upper quadrant and about the greater curvature of the stomach. Adrenals/Urinary Tract: The adrenal glands are within normal limits. The kidneys enhance symmetrically. No renal calculus or hydronephrosis. The bladder is unremarkable. Stomach/Bowel: The stomach is not well delineated due to a complex fluid collection adjacent to the greater curvature and fundus measuring 15.4 x 7.3 x 7.7 cm. There is mild gastric wall thickening with Peri grass trick fat stranding. There is no bowel obstruction, free air, or pneumatosis. Scattered diverticula are noted along the colon without evidence of diverticulitis. Appendix appears normal. Vascular/Lymphatic: There is a hyperdense structure at the splenic hilum measuring 2.5 x 1.7 cm with surrounding hypodense rim measuring 4.0 x 2.9 cm, query splenic artery aneurysm. Examination is limited due timing of contrast bolus. Aorta is normal in caliber. Reproductive: Prostate gland is enlarged. Other: Complex free fluid is noted in the abdomen and pelvis a, likely originating in the left upper quadrant with attenuation most concerning for hemorrhage. A E fat containing umbilical hernia is present. There is a fat containing inguinal hernia on the left. Musculoskeletal: Degenerative changes are present in the lumbar spine. No acute fracture is seen. Review of the MIP images confirms the above findings. IMPRESSION: 1. Large amount of  complex free fluid in the abdomen and pelvis, predominantly centered in the left upper quadrant concerning for hemoperitoneum. 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 Interventional radiology consultation is recommended. 2. Wedge-shaped defect in the posterior aspect of the spleen with surrounding complex free fluid, differential considerations include infarct, laceration if there is history of trauma, or morphologic cleft. 3. Gastric wall thickening with adjacent hyperdense fluid collection and perigastric fat stranding, may be associated to local inflammatory changes. No free air is seen, however gastric  ideology is not completely excluded. 4. No evidence of pulmonary embolism. 5. Trace left pleural effusion. 6. Strandy opacities and consolidation at the lung bases, possible atelectasis or infiltrate 7. Remaining incidental findings as described above. Critical Value/emergent results were called by telephone at the time of interpretation on 07/19/2024 at 3:05 pm to provider [facility] , who verbally acknowledged these results. Electronically Signed   By: Leita Birmingham M.D.   On: 07/19/2024 15:12     Assessment and Plan: Atrial fib - he is mostly unaware except when going fast. He could tell he was out of rhythm when he was going 150/min. His rates are reasonably well controlled. We will add metoprolol daily.  HTN - hopefully the metoprolol will help. Coags - his Chadsvasc is 1. I would recommend holding off on systemic anti-coagulation. If he is still out of rhythm in a month, could consider TEE guided DCCV as an outpatient. Would not want to anti-coagulate with an abdominal bleed.   Risk Assessment/Risk Scores:         CHA2DS2-VASc Score =   1 {      For questions or updates, please contact [facility] Please consult [email or web address] for contact info under    Danelle Birmingham, MD  07/20/2024 1:06 PM

## 2024-07-21 ENCOUNTER — Encounter (HOSPITAL_COMMUNITY): Payer: Self-pay | Admitting: Radiology

## 2024-07-21 ENCOUNTER — Inpatient Hospital Stay (HOSPITAL_COMMUNITY): Payer: Self-pay

## 2024-07-21 ENCOUNTER — Inpatient Hospital Stay (INDEPENDENT_AMBULATORY_CARE_PROVIDER_SITE_OTHER): Payer: Self-pay

## 2024-07-21 ENCOUNTER — Telehealth: Payer: Self-pay | Admitting: Cardiology

## 2024-07-21 DIAGNOSIS — I4891 Unspecified atrial fibrillation: Secondary | ICD-10-CM

## 2024-07-21 LAB — ECHOCARDIOGRAM COMPLETE
AR max vel: 4.24 cm2
AV Area VTI: 5.23 cm2
AV Area mean vel: 4.22 cm2
AV Mean grad: 3 mmHg
AV Peak grad: 6.3 mmHg
Ao pk vel: 1.25 m/s
Area-P 1/2: 4.29 cm2
Calc EF: 60.7 %
MV M vel: 3.32 m/s
MV Peak grad: 44.1 mmHg
MV VTI: 4.46 cm2
S' Lateral: 5.4 cm
Single Plane A2C EF: 66.9 %
Single Plane A4C EF: 55.6 %
Weight: 4271.63 [oz_av]

## 2024-07-21 LAB — TYPE AND SCREEN
ABO/RH(D): A POS
ABO/RH(D): A POS
Antibody Screen: NEGATIVE
Antibody Screen: NEGATIVE
Unit division: 0
Unit division: 0
Unit division: 0
Unit division: 0
Unit division: 0
Unit division: 0

## 2024-07-21 LAB — COMPREHENSIVE METABOLIC PANEL WITH GFR
ALT: 15 U/L (ref 0–44)
AST: 14 U/L — ABNORMAL LOW (ref 15–41)
Albumin: 3.1 g/dL — ABNORMAL LOW (ref 3.5–5.0)
Alkaline Phosphatase: 34 U/L — ABNORMAL LOW (ref 38–126)
Anion gap: 9 (ref 5–15)
BUN: 9 mg/dL (ref 6–20)
CO2: 24 mmol/L (ref 22–32)
Calcium: 8.8 mg/dL — ABNORMAL LOW (ref 8.9–10.3)
Chloride: 104 mmol/L (ref 98–111)
Creatinine, Ser: 0.95 mg/dL (ref 0.61–1.24)
GFR, Estimated: >60 mL/min (ref >60)
Glucose, Bld: 108 mg/dL — ABNORMAL HIGH (ref 70–99)
Potassium: 3.6 mmol/L (ref 3.5–5.1)
Sodium: 137 mmol/L (ref 135–145)
Total Bilirubin: 1.6 mg/dL — ABNORMAL HIGH (ref 0.0–1.2)
Total Protein: 6.3 g/dL — ABNORMAL LOW (ref 6.5–8.1)

## 2024-07-21 LAB — BPAM RBC
Blood Product Expiration Date: 202511112359
Blood Product Expiration Date: 202511142359
Blood Product Expiration Date: 202511142359
Blood Product Expiration Date: 202511142359
ISSUE DATE / TIME: 202510181320
ISSUE DATE / TIME: 202510181320
ISSUE DATE / TIME: 202510181320
ISSUE DATE / TIME: 202510181743
ISSUE DATE / TIME: 202510181743
ISSUE DATE / TIME: 202511112359
ISSUE DATE / TIME: 202511152359
PRODUCT CODE: 202511152359
Unit Type and Rh: 9500
Unit Type and Rh: 9500
Unit Type and Rh: 9500
Unit Type and Rh: 202511142359
Unit Type and Rh: 202511152359
Unit Type and Rh: 202511152359
Unit Type and Rh: 5100
Unit Type and Rh: 5100
Unit Type and Rh: 5100
Unit Type and Rh: 9500

## 2024-07-21 LAB — CBC
HCT: 32.6 % — ABNORMAL LOW (ref 39.0–52.0)
Hemoglobin: 11 g/dL — ABNORMAL LOW (ref 13.0–17.0)
MCH: 29.2 pg (ref 26.0–34.0)
MCHC: 33.7 g/dL (ref 30.0–36.0)
MCV: 86.5 fL (ref 80.0–100.0)
Platelets: 263 K/uL (ref 150–400)
RBC: 3.77 MIL/uL — ABNORMAL LOW (ref 4.22–5.81)
RDW: 14.5 % (ref 11.5–15.5)
WBC: 10 K/uL (ref 4.0–10.5)
nRBC: 0 % (ref 0.0–0.2)

## 2024-07-21 LAB — MAGNESIUM: Magnesium: 2 mg/dL (ref 1.7–2.4)

## 2024-07-21 MED ORDER — PERFLUTREN LIPID MICROSPHERE
1.0000 mL | INTRAVENOUS | Status: AC | PRN
  Administered 2024-07-21: 4 mL via INTRAVENOUS

## 2024-07-21 MED ORDER — METOPROLOL TARTRATE 50 MG PO TABS: 50.0000 mg | ORAL_TABLET | Freq: Three times a day (TID) | ORAL | 0 refills | Status: DC

## 2024-07-21 MED ORDER — HYDRALAZINE HCL 20 MG/ML IJ SOLN
5.0000 mg | Freq: Once | INTRAMUSCULAR | Status: AC
  Administered 2024-07-21: 5 mg via INTRAVENOUS
  Filled 2024-07-21: qty 1

## 2024-07-21 MED ORDER — METOPROLOL TARTRATE 50 MG PO TABS
50.0000 mg | ORAL_TABLET | Freq: Three times a day (TID) | ORAL | Status: DC
  Administered 2024-07-21: 50 mg via ORAL
  Filled 2024-07-21: qty 1

## 2024-07-21 MED ORDER — POTASSIUM CHLORIDE CRYS ER 20 MEQ PO TBCR
40.0000 meq | EXTENDED_RELEASE_TABLET | Freq: Once | ORAL | Status: AC
  Administered 2024-07-21: 40 meq via ORAL
  Filled 2024-07-21: qty 2

## 2024-07-21 NOTE — Progress Notes (Unsigned)
Enrolled for [employer] to mail a ZIO XT long term holter monitor to the patients address on file.  °Dr. Taylor to read. °

## 2024-07-21 NOTE — Progress Notes (Addendum)
 Progress Note  Patient Name: Marcus Scott Date of Encounter: 07/21/2024 [facility] Cardiologist: None   Interval Summary   Not aware of palpitations, although he did occasionally have mild palpitations before the hospitalization. Fatigue preceded the abdominal pain by a couple of weeks. Ventricular rate in the 90s when relaxed, increases to 140s when trying to be physically active.  Vital Signs Vitals:   07/20/24 1946 07/20/24 2315 07/21/24 0450 07/21/24 0807  BP: (!) 154/89 (!) 151/84 137/89 (!) 168/103  Pulse: (!) 110 95 (!) 102 (!) 101  Resp: 17 15 19 20   Temp: 98.8 F (37.1 C) 97.9 F (36.6 C) 98.5 F (36.9 C) 98.1 F (36.7 C)  TempSrc: Oral Oral Oral Oral  SpO2: 100% 100% 96% 96%  Weight:   121.1 kg     Intake/Output Summary (Last 24 hours) at 07/21/2024 0901 Last data filed at 07/21/2024 9167 Gross per 24 hour  Intake 960 ml  Output 351 ml  Net 609 ml      07/21/2024    4:50 AM 01/24/2024   12:11 PM 01/23/2024    1:16 PM  Last 3 Weights  Weight (lbs) 266 lb 15.6 oz 266 lb 265 lb 6.9 oz  Weight (kg) 121.1 kg 120.657 kg 120.4 kg      Telemetry/ECG  Atrial fibrillation with fair ventricular rate control- Personally Reviewed  Physical Exam  GEN: No acute distress.   Neck: No JVD Cardiac: Irregular,  no murmurs, rubs, or gallops.  Respiratory: Clear to auscultation bilaterally. 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  Embolic risk is low (CHA2DS2-VASc or 1 for hypertension only).  Not started on anticoagulation.  Plan to reevaluate for in 1 month, DC cardioversion if the atrial fibrillation is persistent.  Increase metoprolol to 50 mg 3 times daily.  If this provides satisfactory ventricular rate control plan to  discharge on metoprolol succinate 150 mg once daily.  For echocardiogram today.  Unless there are some surprising findings on the echo anticipate he will be discharged later today.  Plan to bring him back to the office in a couple of weeks.  If he is still in persistent atrial fibrillation we will set up for cardioversion.  If he is not particularly symptomatic we will plan 3 weeks of anticoagulation with Eliquis before the cardioversion and then 4 weeks to follow-up.  If he is symptomatic from the atrial fibrillation, we can accelerate the process by scheduling directly for a TEE cardioversion after as little as 3 doses of Eliquis, followed by 4 weeks of anticoagulation.  Long-term, if he has recurrent A-fib would recommend a strategy of early ablation rather than antiarrhythmic medications, in view of his age.  Also recommend outpatient sleep study.  He has some daytime hypersomnolence, snores, is morbidly obese with a very large neck circumference.  STOP BANG score 5 (age, obesity, neck circumference, snoring, daytime somnolence)  [facility] will sign off, pending review of the echocardiogram.   Medication Recommendations:  Metoprolol succinate 150 mg once daily Hydrochlorothiazide 12.5 mg daily Stop olmesartan Other recommendations (labs, testing, etc): Recommended purchasing some type of personal electronic rhythm monitor such as a smart watch with ECG capability or a Kardia device. Follow up as an outpatient: Follow-up in A-fib clinic in 2 weeks Outpatient sleep study (we will arrange)  For questions or updates, please contact [facility] Please consult [email or web address] for contact info under         Signed, Jerel Balding, MD

## 2024-07-21 NOTE — Discharge Instructions (Signed)
 Expect a call to follow up embolization procedure with Dr. Karalee. You will be seen in clinic with imaging in January. If concerns arise before then, you may reach the IR patient care line at [phone]

## 2024-07-21 NOTE — Telephone Encounter (Signed)
 Please order 3 day monitor for afib. Want to assess rates. Dr. Francyne to read.

## 2024-07-21 NOTE — Progress Notes (Signed)
 Referring Physician(s): Dr. Paola  Supervising Physician: Vanice Revel  Patient Status:  [facility] - In-pt  Chief Complaint:  S/p splenic artery embolization by Dr. Karalee 07/19/24  Brief History: Male who experienced sudden onset abdominal pain 2 days ago with subsequent abdominal distention, presented to the ED. CT was positive for hemoperitoneum and splenic artery injury aneurysm.  Patient was seen by Dr. Charm with trauma surgery with exam showing him stable for endovascular repair.  IR was consulted and Dr. 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   Subjective:  Patient lying in bed, family at bedside.  He denies abdominal pain, fever, chills, dizziness, any other concern for post embolic syndrome except mild fatigue.  He does report transient right leg pain lateral to puncture site; however, this has resolved prior to bedside exam.  Allergies: Patient has no known allergies.  Medications: Prior to Admission medications   Medication Sig Start Date End Date Taking? Authorizing Provider  hydrochlorothiazide (HYDRODIURIL) 12.5 MG tablet Take 12.5 mg by mouth daily. 04/01/24  Yes [provider]  olmesartan (BENICAR) 5 MG tablet Take 10 mg by mouth daily. 04/13/24  Yes [provider]  ascorbic acid (VITAMIN C) 500 MG tablet Take 500 mg by mouth daily. Patient not taking: Reported on 07/20/2024    [provider]  meclizine (ANTIVERT) 25 MG tablet Take 25 mg by mouth 2 (two) times daily as needed for dizziness. Patient not taking: Reported on 07/20/2024 04/10/24   [provider]  oxyCODONE  (ROXICODONE ) 5 MG immediate release tablet Take 1 tablet (5 mg total) by mouth every 6 (six) hours as needed for severe pain (pain score 7-10). Patient not taking: Reported on 07/20/2024 01/24/24   Agarwala, Gildardo, MD  oxyCODONE -acetaminophen   (PERCOCET) 5-325 MG tablet Take 1 tablet by mouth every 6 (six) hours as needed. Patient not taking: Reported on 07/20/2024 01/19/24   Patt Alm Macho, MD  Vitamin D, Cholecalciferol, 25 MCG (1000 UT) CAPS Take by mouth. Patient not taking: Reported on 07/20/2024    [provider]  zinc gluconate 50 MG tablet Take 50 mg by mouth daily. Patient not taking: Reported on 07/20/2024    [provider]     Vital Signs: BP (!) 168/103 (BP Location: Left Arm)   Pulse (!) 101   Temp 98.1 F (36.7 C) (Oral)   Resp 20   Wt 266 lb 15.6 oz (121.1 kg)   SpO2 96%   BMI 43.09 kg/m   Physical Exam Cardiovascular:     Rate and Rhythm: Normal rate and regular rhythm.  Pulmonary:     Effort: Pulmonary effort is normal.  Abdominal:     Palpations: Abdomen is soft.     Tenderness: There is no abdominal tenderness.  Skin:    General: Skin is warm and dry.     Comments: R CFA site soft, nontender, minimal brusing, no swelling or palpable mass. Previously reported leg pain is pointed to by patient, site lateral and distal to puncture and not of concern that it is related.  Distal sensation/circulation intact  Neurological:     Mental Status: He is alert and oriented to person, place, and time.  Psychiatric:        Mood and Affect: Mood normal.        Behavior: Behavior normal.     Imaging: IR Angiogram Visceral Selective Result Date: 07/20/2024 INDICATION: 63 year old male transferred emergently  from 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 Upon arrival, it was deemed that the patient was stable enough to undergo endovascular repair rather than emergent laparotomy and splenectomy. 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 EXAM: SELECTIVE VISCERAL ARTERIOGRAPHY; IR EMBO ART VEN HEMORR LYMPH EXTRAV  INC GUIDE ROADMAPPING; ADDITIONAL ARTERIOGRAPHY; IR ULTRASOUND GUIDANCE VASC ACCESS RIGHT MEDICATIONS: Metoprolol was administered intravenously by the Radiology nurse in 5 mg aliquots until heart rate was normalized. A total of 10 mg was administered. Following the procedure, 30 mg of Toradol was administered intramuscular by the radiology nurse. ANESTHESIA/SEDATION: Moderate (conscious) sedation was employed during this procedure. A total of Versed  4 mg and Fentanyl  200 mcg was administered intravenously. Moderate Sedation Time: 85 minutes. The patient's level of consciousness and vital signs were monitored continuously by radiology nursing throughout the procedure under my direct supervision. CONTRAST:  80mL OMNIPAQUE IOHEXOL 300 MG/ML  SOLN FLUOROSCOPY: Radiation Exposure Index (as provided by the fluoroscopic device): 2180 mGy Kerma COMPLICATIONS: None immediate. PROCEDURE: Informed consent was obtained from the patient following explanation of the procedure, risks, benefits and alternatives. The patient understands, agrees and consents for the procedure. All questions were addressed. A time out was performed prior to the initiation of the procedure. Maximal barrier sterile technique utilized including caps, mask, sterile gowns, sterile gloves, large sterile drape, hand hygiene, and Betadine prep. The right common femoral artery was interrogated with ultrasound and found to be widely patent. An image was obtained and stored for the medical record. Local anesthesia was attained by infiltration with 1% lidocaine . A small dermatotomy was made. Under real-time sonographic guidance, the vessel was punctured with a 21 gauge micropuncture needle. Using standard technique, the initial micro needle was exchanged over a 0.018 micro wire for a transitional 4 [place] micro sheath. The micro sheath was then exchanged over a 0.035 wire for a 5 [place] vascular sheath A C2 cobra catheter was advanced over a Bentson wire and  used to select the celiac artery. A limited celiac arteriogram was performed. Partial filling of the large splenic artery aneurysm is visualized distally. The proximal pathway of the splenic artery was identified. Utilizing a Glidewire, the C2 cobra catheter was advanced further into the splenic artery. Arteriography was then performed in multiple obliquities. The main splenic artery bifurcates into 2 main branches. There is a large 2.7 x 2.2 x 2.3 cm aneurysm arising from 1 of the code dominant branches. No evidence of active bleeding at the time of arteriography. At this time, the decision was made to change 5 French catheter plaque forms. Therefore, the initial 65 cm cobra catheter was exchanged over a Rosen wire for a 100 cm angled glide catheter. The glide catheter was then further navigated over a Glidewire into the first of the splenic artery branches. Arteriography was performed. No aneurysm arising from this branch. There is excellent opacification of at least 30-50% of the splenic volume. The catheter was next advanced into the other branch of the splenic artery. 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 This branch supplies at least 50% of the splenic parenchyma. Unfortunately, given the anatomy of the aneurysm and the fact that it has already recently ruptured, preservation of this branch of the splenic artery is not possible. Therefore, a lantern microcatheter was introduced through the 5 [place] base catheter  over a Fathom 16 wire. The microcatheter was carefully advanced through the aneurysm and into the outflow artery. Coil embolization was then performed using a series of penumbra detachable microcoils. Once the outflow artery was successfully embolized, several framing coils were placed in the large aneurysm sac in order to attain stability and provide a scaffold to form coils in the inflow artery. The microcatheter was brought back to the neck of the aneurysm and additional  coil embolization was performed of the inflow artery. A completely occlusive metal coil pack was successfully deployed. Follow-up angiography confirms no further filling of the aneurysm and successful preservation of the non aneurysmal branch of the splenic artery. The catheters were removed. The access site was closed with a Celt arterial closure device. IMPRESSION: 1. 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 2. Successful coil embolization of the splenic artery. It was necessary to sacrifice the affected branch of the splenic artery which will likely result in a splenic infarction of between 30 and 50% of the overall splenic volume. PLAN: Please expect post embolization syndrome in the days following the procedure. The patient is likely to experience left upper quadrant pain, referred pain to the left scapula as well as malaise, leukocytosis and potentially low-grade fever. A reactive left-sided pleural effusion is also a possibility. These effects can be managed conservatively with NSAIDs, and a steroid taper if necessary as well as narcotic pain medications. Follow-up in IR clinic with repeat CT arteriogram of the abdomen and pelvis in January. Electronically Signed   By: Wilkie Lent M.D.   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 Upon arrival, it was deemed that the patient was stable enough to undergo endovascular repair rather than emergent laparotomy and splenectomy. 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 EXAM: SELECTIVE VISCERAL ARTERIOGRAPHY; IR EMBO ART VEN HEMORR LYMPH EXTRAV INC GUIDE ROADMAPPING; ADDITIONAL ARTERIOGRAPHY; IR ULTRASOUND GUIDANCE  VASC ACCESS RIGHT MEDICATIONS: Metoprolol was administered intravenously by the Radiology nurse in 5 mg aliquots until heart rate was normalized. A total of 10 mg was administered. Following the procedure, 30 mg of Toradol was administered intramuscular by the radiology nurse. ANESTHESIA/SEDATION: Moderate (conscious) sedation was employed during this procedure. A total of Versed  4 mg and Fentanyl  200 mcg was administered intravenously. Moderate Sedation Time: 85 minutes. The patient's level of consciousness and vital signs were monitored continuously by radiology nursing throughout the procedure under my direct supervision. CONTRAST:  80mL OMNIPAQUE IOHEXOL 300 MG/ML  SOLN FLUOROSCOPY: Radiation Exposure Index (as provided by the fluoroscopic device): 2180 mGy Kerma COMPLICATIONS: None immediate. PROCEDURE: Informed consent was obtained from the patient following explanation of the procedure, risks, benefits and alternatives. The patient understands, agrees and consents for the procedure. All questions were addressed. A time out was performed prior to the initiation of the procedure. Maximal barrier sterile technique utilized including caps, mask, sterile gowns, sterile gloves, large sterile drape, hand hygiene, and Betadine prep. The right common femoral artery was interrogated with ultrasound and found to be widely patent. An image was obtained and stored for the medical record. Local anesthesia was attained by infiltration with 1% lidocaine . A small dermatotomy was made. Under real-time sonographic  guidance, the vessel was punctured with a 21 gauge micropuncture needle. Using standard technique, the initial micro needle was exchanged over a 0.018 micro wire for a transitional 4 [place] micro sheath. The micro sheath was then exchanged over a 0.035 wire for a 5 [place] vascular sheath A C2 cobra catheter was advanced over a Bentson wire and used to select the celiac artery. A limited celiac arteriogram was  performed. Partial filling of the large splenic artery aneurysm is visualized distally. The proximal pathway of the splenic artery was identified. Utilizing a Glidewire, the C2 cobra catheter was advanced further into the splenic artery. Arteriography was then performed in multiple obliquities. The main splenic artery bifurcates into 2 main branches. There is a large 2.7 x 2.2 x 2.3 cm aneurysm arising from 1 of the code dominant branches. No evidence of active bleeding at the time of arteriography. At this time, the decision was made to change 5 French catheter plaque forms. Therefore, the initial 65 cm cobra catheter was exchanged over a Rosen wire for a 100 cm angled glide catheter. The glide catheter was then further navigated over a Glidewire into the first of the splenic artery branches. Arteriography was performed. No aneurysm arising from this branch. There is excellent opacification of at least 30-50% of the splenic volume. The catheter was next advanced into the other branch of the splenic artery. 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 This branch supplies at least 50% of the splenic parenchyma. Unfortunately, given the anatomy of the aneurysm and the fact that it has already recently ruptured, preservation of this branch of the splenic artery is not possible. Therefore, a lantern microcatheter was introduced through the 5 [place] base catheter over a Fathom 16 wire. The microcatheter was carefully advanced through the aneurysm and into the outflow artery. Coil embolization was then performed using a series of penumbra detachable microcoils. Once the outflow artery was successfully embolized, several framing coils were placed in the large aneurysm sac in order to attain stability and provide a scaffold to form coils in the inflow artery. The microcatheter was brought back to the neck of the aneurysm and additional coil embolization was performed of the inflow artery. A completely  occlusive metal coil pack was successfully deployed. Follow-up angiography confirms no further filling of the aneurysm and successful preservation of the non aneurysmal branch of the splenic artery. The catheters were removed. The access site was closed with a Celt arterial closure device. IMPRESSION: 1. 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 2. Successful coil embolization of the splenic artery. It was necessary to sacrifice the affected branch of the splenic artery which will likely result in a splenic infarction of between 30 and 50% of the overall splenic volume. PLAN: Please expect post embolization syndrome in the days following the procedure. The patient is likely to experience left upper quadrant pain, referred pain to the left scapula as well as malaise, leukocytosis and potentially low-grade fever. A reactive left-sided pleural effusion is also a possibility. These effects can be managed conservatively with NSAIDs, and a steroid taper if necessary as well as narcotic pain medications. Follow-up in IR clinic with repeat CT arteriogram of the abdomen and pelvis in January. Electronically Signed   By: Wilkie Lent M.D.   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 Upon arrival, it was deemed that the patient was stable enough to undergo endovascular repair rather than emergent laparotomy and splenectomy. 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 EXAM: SELECTIVE VISCERAL ARTERIOGRAPHY; IR EMBO ART VEN HEMORR LYMPH EXTRAV INC GUIDE ROADMAPPING; ADDITIONAL ARTERIOGRAPHY; IR ULTRASOUND GUIDANCE VASC ACCESS RIGHT MEDICATIONS: Metoprolol was  administered intravenously by the Radiology nurse in 5 mg aliquots until heart rate was normalized. A total of 10 mg was administered. Following the procedure, 30 mg of Toradol was administered intramuscular by the radiology nurse. ANESTHESIA/SEDATION: Moderate (conscious) sedation was employed during this procedure. A total of Versed  4 mg and Fentanyl  200 mcg was administered intravenously. Moderate Sedation Time: 85 minutes. The patient's level of consciousness and vital signs were monitored continuously by radiology nursing throughout the procedure under my direct supervision. CONTRAST:  80mL OMNIPAQUE IOHEXOL 300 MG/ML  SOLN FLUOROSCOPY: Radiation Exposure Index (as provided by the fluoroscopic device): 2180 mGy Kerma COMPLICATIONS: None immediate. PROCEDURE: Informed consent was obtained from the patient following explanation of the procedure, risks, benefits and alternatives. The patient understands, agrees and consents for the procedure. All questions were addressed. A time out was performed prior to the initiation of the procedure. Maximal barrier sterile technique utilized including caps, mask, sterile gowns, sterile gloves, large sterile drape, hand hygiene, and Betadine prep. The right common femoral artery was interrogated with ultrasound and found to be widely patent. An image was obtained and stored for the medical record. Local anesthesia was attained by infiltration with 1% lidocaine . A small dermatotomy was made. Under real-time sonographic guidance, the vessel was punctured with a 21 gauge micropuncture needle. Using standard technique, the initial micro needle was exchanged over a 0.018 micro wire for a transitional 4 [place] micro sheath. The micro sheath was then exchanged over a 0.035 wire for a 5 [place] vascular sheath A C2 cobra catheter was advanced over a Bentson wire and used to select the celiac artery. A limited celiac arteriogram was performed. Partial filling of the large splenic artery  aneurysm is visualized distally. The proximal pathway of the splenic artery was identified. Utilizing a Glidewire, the C2 cobra catheter was advanced further into the splenic artery. Arteriography was then performed in multiple obliquities. The main splenic artery bifurcates into 2 main branches. There is a large 2.7 x 2.2 x 2.3 cm aneurysm arising from 1 of the code dominant branches. No evidence of active bleeding at the time of arteriography. At this time, the decision was made to change 5 French catheter plaque forms. Therefore, the initial 65 cm cobra catheter was exchanged over a Rosen wire for a 100 cm angled glide catheter. The glide catheter was then further navigated over a Glidewire into the first of the splenic artery branches. Arteriography was performed. No aneurysm arising from this branch. There is excellent opacification of at least 30-50% of the splenic volume. The catheter was next advanced into the other branch of the splenic artery. 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 This branch supplies at least 50% of the splenic parenchyma. Unfortunately, given the anatomy of the aneurysm and the fact that it has already recently ruptured, preservation of this branch of the splenic artery is not possible. Therefore, a lantern microcatheter was introduced through the 5 [place] base catheter over a Fathom 16 wire. The microcatheter was carefully advanced through the  aneurysm and into the outflow artery. Coil embolization was then performed using a series of penumbra detachable microcoils. Once the outflow artery was successfully embolized, several framing coils were placed in the large aneurysm sac in order to attain stability and provide a scaffold to form coils in the inflow artery. The microcatheter was brought back to the neck of the aneurysm and additional coil embolization was performed of the inflow artery. A completely occlusive metal coil pack was successfully deployed.  Follow-up angiography confirms no further filling of the aneurysm and successful preservation of the non aneurysmal branch of the splenic artery. The catheters were removed. The access site was closed with a Celt arterial closure device. IMPRESSION: 1. 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 2. Successful coil embolization of the splenic artery. It was necessary to sacrifice the affected branch of the splenic artery which will likely result in a splenic infarction of between 30 and 50% of the overall splenic volume. PLAN: Please expect post embolization syndrome in the days following the procedure. The patient is likely to experience left upper quadrant pain, referred pain to the left scapula as well as malaise, leukocytosis and potentially low-grade fever. A reactive left-sided pleural effusion is also a possibility. These effects can be managed conservatively with NSAIDs, and a steroid taper if necessary as well as narcotic pain medications. Follow-up in IR clinic with repeat CT arteriogram of the abdomen and pelvis in January. Electronically Signed   By: Wilkie Lent M.D.   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 Upon arrival, it was deemed that the patient was stable enough to undergo endovascular repair rather than emergent laparotomy and splenectomy. 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 EXAM: SELECTIVE VISCERAL ARTERIOGRAPHY; IR EMBO ART VEN HEMORR LYMPH EXTRAV INC GUIDE ROADMAPPING; ADDITIONAL ARTERIOGRAPHY; IR ULTRASOUND GUIDANCE VASC ACCESS RIGHT MEDICATIONS: Metoprolol was administered intravenously by the Radiology nurse in 5 mg  aliquots until heart rate was normalized. A total of 10 mg was administered. Following the procedure, 30 mg of Toradol was administered intramuscular by the radiology nurse. ANESTHESIA/SEDATION: Moderate (conscious) sedation was employed during this procedure. A total of Versed  4 mg and Fentanyl  200 mcg was administered intravenously. Moderate Sedation Time: 85 minutes. The patient's level of consciousness and vital signs were monitored continuously by radiology nursing throughout the procedure under my direct supervision. CONTRAST:  80mL OMNIPAQUE IOHEXOL 300 MG/ML  SOLN FLUOROSCOPY: Radiation Exposure Index (as provided by the fluoroscopic device): 2180 mGy Kerma COMPLICATIONS: None immediate. PROCEDURE: Informed consent was obtained from the patient following explanation of the procedure, risks, benefits and alternatives. The patient understands, agrees and consents for the procedure. All questions were addressed. A time out was performed prior to the initiation of the procedure. Maximal barrier sterile technique utilized including caps, mask, sterile gowns, sterile gloves, large sterile drape, hand hygiene, and Betadine prep. The right common femoral artery was interrogated with ultrasound and found to be widely patent. An image was obtained and stored for the medical record. Local anesthesia was attained by infiltration with 1% lidocaine . A small dermatotomy was made. Under real-time sonographic guidance, the vessel was punctured with a 21 gauge micropuncture needle. Using  standard technique, the initial micro needle was exchanged over a 0.018 micro wire for a transitional 4 [place] micro sheath. The micro sheath was then exchanged over a 0.035 wire for a 5 [place] vascular sheath A C2 cobra catheter was advanced over a Bentson wire and used to select the celiac artery. A limited celiac arteriogram was performed. Partial filling of the large splenic artery aneurysm is visualized distally. The proximal pathway of  the splenic artery was identified. Utilizing a Glidewire, the C2 cobra catheter was advanced further into the splenic artery. Arteriography was then performed in multiple obliquities. The main splenic artery bifurcates into 2 main branches. There is a large 2.7 x 2.2 x 2.3 cm aneurysm arising from 1 of the code dominant branches. No evidence of active bleeding at the time of arteriography. At this time, the decision was made to change 5 French catheter plaque forms. Therefore, the initial 65 cm cobra catheter was exchanged over a Rosen wire for a 100 cm angled glide catheter. The glide catheter was then further navigated over a Glidewire into the first of the splenic artery branches. Arteriography was performed. No aneurysm arising from this branch. There is excellent opacification of at least 30-50% of the splenic volume. The catheter was next advanced into the other branch of the splenic artery. 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 This branch supplies at least 50% of the splenic parenchyma. Unfortunately, given the anatomy of the aneurysm and the fact that it has already recently ruptured, preservation of this branch of the splenic artery is not possible. Therefore, a lantern microcatheter was introduced through the 5 [place] base catheter over a Fathom 16 wire. The microcatheter was carefully advanced through the aneurysm and into the outflow artery. Coil embolization was then performed using a series of penumbra detachable microcoils. Once the outflow artery was successfully embolized, several framing coils were placed in the large aneurysm sac in order to attain stability and provide a scaffold to form coils in the inflow artery. The microcatheter was brought back to the neck of the aneurysm and additional coil embolization was performed of the inflow artery. A completely occlusive metal coil pack was successfully deployed. Follow-up angiography confirms no further filling of the  aneurysm and successful preservation of the non aneurysmal branch of the splenic artery. The catheters were removed. The access site was closed with a Celt arterial closure device. IMPRESSION: 1. 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 2. Successful coil embolization of the splenic artery. It was necessary to sacrifice the affected branch of the splenic artery which will likely result in a splenic infarction of between 30 and 50% of the overall splenic volume. PLAN: Please expect post embolization syndrome in the days following the procedure. The patient is likely to experience left upper quadrant pain, referred pain to the left scapula as well as malaise, leukocytosis and potentially low-grade fever. A reactive left-sided pleural effusion is also a possibility. These effects can be managed conservatively with NSAIDs, and a steroid taper if necessary as well as narcotic pain medications. Follow-up in IR clinic with repeat CT arteriogram of the abdomen and pelvis in January. Electronically Signed   By: Wilkie Lent M.D.   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 Upon arrival, it was deemed that the patient was stable enough to undergo endovascular repair rather than emergent laparotomy and splenectomy. 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 EXAM: SELECTIVE VISCERAL ARTERIOGRAPHY; IR EMBO ART VEN HEMORR LYMPH EXTRAV INC GUIDE ROADMAPPING; ADDITIONAL ARTERIOGRAPHY; IR ULTRASOUND GUIDANCE VASC ACCESS RIGHT MEDICATIONS: Metoprolol was administered intravenously by the Radiology nurse in 5 mg aliquots until heart rate was normalized. A  total of 10 mg was administered. Following the procedure, 30 mg of Toradol was administered intramuscular by the radiology nurse. ANESTHESIA/SEDATION: Moderate (conscious) sedation was employed during this procedure. A total of Versed  4 mg and Fentanyl  200 mcg was administered intravenously. Moderate Sedation Time: 85 minutes. The patient's level of consciousness and vital signs were monitored continuously by radiology nursing throughout the procedure under my direct supervision. CONTRAST:  80mL OMNIPAQUE IOHEXOL 300 MG/ML  SOLN FLUOROSCOPY: Radiation Exposure Index (as provided by the fluoroscopic device): 2180 mGy Kerma COMPLICATIONS: None immediate. PROCEDURE: Informed consent was obtained from the patient following explanation of the procedure, risks, benefits and alternatives. The patient understands, agrees and consents for the procedure. All questions were addressed. A time out was performed prior to the initiation of the procedure. Maximal barrier sterile technique utilized including caps, mask, sterile gowns, sterile gloves, large sterile drape, hand hygiene, and Betadine prep. The right common femoral artery was interrogated with ultrasound and found to be widely patent. An image was obtained and stored for the medical record. Local anesthesia was attained by infiltration with 1% lidocaine . A small dermatotomy was made. Under real-time sonographic guidance, the vessel was punctured with a 21 gauge micropuncture needle. Using standard technique, the initial micro needle was exchanged over a 0.018 micro wire for a transitional 4 [place] micro sheath. The micro sheath was then exchanged over a 0.035 wire for a 5 [place] vascular sheath A C2 cobra catheter was advanced over a Bentson wire and used to select the celiac artery. A limited celiac arteriogram was performed. Partial filling of the large splenic artery aneurysm is visualized distally. The proximal pathway of the splenic artery was identified. Utilizing  a Glidewire, the C2 cobra catheter was advanced further into the splenic artery. Arteriography was then performed in multiple obliquities. The main splenic artery bifurcates into 2 main branches. There is a large 2.7 x 2.2 x 2.3 cm aneurysm arising from 1 of the code dominant branches. No evidence of active bleeding at the time of arteriography. At this time, the decision was made to change 5 French catheter plaque forms. Therefore, the initial 65 cm cobra catheter was exchanged over a Rosen wire for a 100 cm angled glide catheter. The glide catheter was then further navigated over a Glidewire into the first of the splenic artery branches. Arteriography was performed. No aneurysm arising from this branch. There is excellent opacification of at least 30-50% of the splenic volume. The catheter was next advanced into the other branch of the splenic artery. 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 This branch supplies at least 50% of the splenic parenchyma. Unfortunately, given the anatomy of the aneurysm and the fact that it has already recently ruptured, preservation of this branch of the splenic artery is not possible. Therefore, a lantern microcatheter was introduced through the 5 [place] base catheter over a Fathom 16 wire. The microcatheter was carefully advanced through the aneurysm and into the outflow artery.  Coil embolization was then performed using a series of penumbra detachable microcoils. Once the outflow artery was successfully embolized, several framing coils were placed in the large aneurysm sac in order to attain stability and provide a scaffold to form coils in the inflow artery. The microcatheter was brought back to the neck of the aneurysm and additional coil embolization was performed of the inflow artery. A completely occlusive metal coil pack was successfully deployed. Follow-up angiography confirms no further filling of the aneurysm and successful preservation of the non  aneurysmal branch of the splenic artery. The catheters were removed. The access site was closed with a Celt arterial closure device. IMPRESSION: 1. 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 2. Successful coil embolization of the splenic artery. It was necessary to sacrifice the affected branch of the splenic artery which will likely result in a splenic infarction of between 30 and 50% of the overall splenic volume. PLAN: Please expect post embolization syndrome in the days following the procedure. The patient is likely to experience left upper quadrant pain, referred pain to the left scapula as well as malaise, leukocytosis and potentially low-grade fever. A reactive left-sided pleural effusion is also a possibility. These effects can be managed conservatively with NSAIDs, and a steroid taper if necessary as well as narcotic pain medications. Follow-up in IR clinic with repeat CT arteriogram of the abdomen and pelvis in January. Electronically Signed   By: Wilkie Lent M.D.   On: 07/20/2024 07:27   CT ABDOMEN PELVIS W CONTRAST Result Date: 07/19/2024 CLINICAL DATA:  Bowel obstruction suspected. Pulmonary embolism suspected, high probability. EXAM: CT ANGIOGRAPHY CHEST CT ABDOMEN AND PELVIS WITH CONTRAST TECHNIQUE: Multidetector CT imaging of the chest was performed using the standard protocol during bolus administration of intravenous contrast. Multiplanar CT image reconstructions and MIPs were obtained to evaluate the vascular anatomy. Multidetector CT imaging of the abdomen and pelvis was performed using the standard protocol during bolus administration of intravenous contrast. RADIATION DOSE REDUCTION: This exam was performed according to the departmental dose-optimization program which includes automated exposure control, adjustment of the mA and/or kV according to patient size and/or use of iterative reconstruction technique. CONTRAST:  100mL OMNIPAQUE IOHEXOL 350 MG/ML  SOLN COMPARISON:  None Available. FINDINGS: CTA CHEST FINDINGS Cardiovascular: The heart is enlarged and no significant pericardial effusion is seen. Scattered coronary artery calcifications are noted. The aorta is normal in caliber. The pulmonary trunk is mildly distended which may be associated with underlying pulmonary artery hypertension. No definite pulmonary embolism is seen. Evaluation of the pulmonary arteries is limited due to mixing artifact and respiratory motion. Mediastinum/Nodes: No mediastinal, hilar, or axillary lymphadenopathy by size criteria. The thyroid gland, trachea, and esophagus are within normal limits. Lungs/Pleura: There is a trace left pleural effusion. Multifocal strandy opacities and consolidation are seen at the lung bases bilaterally. No pneumothorax. Musculoskeletal: Degenerative changes are present in the thoracic spine. No acute osseous abnormality. Review of the MIP images confirms the above findings. CT ABDOMEN and PELVIS FINDINGS Hepatobiliary: No focal abnormality in the liver. No biliary ductal dilatation is seen. Multiple stones are present within the gallbladder. Pancreas: The pancreas is within normal limits. No pancreatic ductal dilatation. Spleen: Normal size. There is a wedge-shaped defect in the posterior aspect of the spleen with perisplenic complex free fluid extending into the left upper quadrant and about the greater curvature of the stomach. Adrenals/Urinary Tract: The adrenal glands are within normal limits. The kidneys enhance symmetrically.  No renal calculus or hydronephrosis. The bladder is unremarkable. Stomach/Bowel: The stomach is not well delineated due to a complex fluid collection adjacent to the greater curvature and fundus measuring 15.4 x 7.3 x 7.7 cm. There is mild gastric wall thickening with Peri grass trick fat stranding. There is no bowel obstruction, free air, or pneumatosis. Scattered diverticula are noted along the colon without evidence of  diverticulitis. Appendix appears normal. Vascular/Lymphatic: There is a hyperdense structure at the splenic hilum measuring 2.5 x 1.7 cm with surrounding hypodense rim measuring 4.0 x 2.9 cm, query splenic artery aneurysm. Examination is limited due timing of contrast bolus. Aorta is normal in caliber. Reproductive: Prostate gland is enlarged. Other: Complex free fluid is noted in the abdomen and pelvis a, likely originating in the left upper quadrant with attenuation most concerning for hemorrhage. A E fat containing umbilical hernia is present. There is a fat containing inguinal hernia on the left. Musculoskeletal: Degenerative changes are present in the lumbar spine. No acute fracture is seen. Review of the MIP images confirms the above findings. IMPRESSION: 1. Large amount of complex free fluid in the abdomen and pelvis, predominantly centered in the left upper quadrant concerning for hemoperitoneum. 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 Interventional radiology consultation is recommended. 2. Wedge-shaped defect in the posterior aspect of the spleen with surrounding complex free fluid, differential considerations include infarct, laceration if there is history of trauma, or morphologic cleft. 3. Gastric wall thickening with adjacent hyperdense fluid collection and perigastric fat stranding, may be associated to local inflammatory changes. No free air is seen, however gastric ideology is not completely excluded. 4. No evidence of pulmonary embolism. 5. Trace left pleural effusion. 6. Strandy opacities and consolidation at the lung bases, possible atelectasis or infiltrate 7. Remaining incidental findings as described above. Critical Value/emergent results were called by telephone at the time of interpretation on 07/19/2024 at 3:05 pm to provider [facility] , who verbally acknowledged these  results. Electronically Signed   By: Leita Birmingham M.D.   On: 07/19/2024 15:12   CT Angio Chest PE W and/or Wo Contrast Result Date: 07/19/2024 CLINICAL DATA:  Bowel obstruction suspected. Pulmonary embolism suspected, high probability. EXAM: CT ANGIOGRAPHY CHEST CT ABDOMEN AND PELVIS WITH CONTRAST TECHNIQUE: Multidetector CT imaging of the chest was performed using the standard protocol during bolus administration of intravenous contrast. Multiplanar CT image reconstructions and MIPs were obtained to evaluate the vascular anatomy. Multidetector CT imaging of the abdomen and pelvis was performed using the standard protocol during bolus administration of intravenous contrast. RADIATION DOSE REDUCTION: This exam was performed according to the departmental dose-optimization program which includes automated exposure control, adjustment of the mA and/or kV according to patient size and/or use of iterative reconstruction technique. CONTRAST:  100mL OMNIPAQUE IOHEXOL 350 MG/ML SOLN COMPARISON:  None Available. FINDINGS: CTA CHEST FINDINGS Cardiovascular: The heart is enlarged and no significant pericardial effusion is seen. Scattered coronary artery calcifications are noted. The aorta is normal in caliber. The pulmonary trunk is mildly distended which may be associated with underlying pulmonary artery hypertension. No definite pulmonary embolism is seen. Evaluation of the pulmonary arteries is limited due to mixing artifact and respiratory motion. Mediastinum/Nodes: No mediastinal, hilar, or axillary lymphadenopathy by size criteria. The thyroid gland, trachea, and esophagus are within normal limits. Lungs/Pleura: There is a trace left pleural effusion. Multifocal strandy opacities and consolidation  are seen at the lung bases bilaterally. No pneumothorax. Musculoskeletal: Degenerative changes are present in the thoracic spine. No acute osseous abnormality. Review of the MIP images confirms the above findings. CT  ABDOMEN and PELVIS FINDINGS Hepatobiliary: No focal abnormality in the liver. No biliary ductal dilatation is seen. Multiple stones are present within the gallbladder. Pancreas: The pancreas is within normal limits. No pancreatic ductal dilatation. Spleen: Normal size. There is a wedge-shaped defect in the posterior aspect of the spleen with perisplenic complex free fluid extending into the left upper quadrant and about the greater curvature of the stomach. Adrenals/Urinary Tract: The adrenal glands are within normal limits. The kidneys enhance symmetrically. No renal calculus or hydronephrosis. The bladder is unremarkable. Stomach/Bowel: The stomach is not well delineated due to a complex fluid collection adjacent to the greater curvature and fundus measuring 15.4 x 7.3 x 7.7 cm. There is mild gastric wall thickening with Peri grass trick fat stranding. There is no bowel obstruction, free air, or pneumatosis. Scattered diverticula are noted along the colon without evidence of diverticulitis. Appendix appears normal. Vascular/Lymphatic: There is a hyperdense structure at the splenic hilum measuring 2.5 x 1.7 cm with surrounding hypodense rim measuring 4.0 x 2.9 cm, query splenic artery aneurysm. Examination is limited due timing of contrast bolus. Aorta is normal in caliber. Reproductive: Prostate gland is enlarged. Other: Complex free fluid is noted in the abdomen and pelvis a, likely originating in the left upper quadrant with attenuation most concerning for hemorrhage. A E fat containing umbilical hernia is present. There is a fat containing inguinal hernia on the left. Musculoskeletal: Degenerative changes are present in the lumbar spine. No acute fracture is seen. Review of the MIP images confirms the above findings. IMPRESSION: 1. Large amount of complex free fluid in the abdomen and pelvis, predominantly centered in the left upper quadrant concerning for hemoperitoneum. An oval structure with a central  hyperdense region is seen at the splenic hilum measuring up to 4 cm, suggesting splenic artery aneurysm with possible rupture. Interventional radiology consultation is recommended. 2. Wedge-shaped defect in the posterior aspect of the spleen with surrounding complex free fluid, differential considerations include infarct, laceration if there is history of trauma, or morphologic cleft. 3. Gastric wall thickening with adjacent hyperdense fluid collection and perigastric fat stranding, may be associated to local inflammatory changes. No free air is seen, however gastric ideology is not completely excluded. 4. No evidence of pulmonary embolism. 5. Trace left pleural effusion. 6. Strandy opacities and consolidation at the lung bases, possible atelectasis or infiltrate 7. Remaining incidental findings as described above. Critical Value/emergent results were called by telephone at the time of interpretation on 07/19/2024 at 3:05 pm to provider [facility] , who verbally acknowledged these results. Electronically Signed   By: Leita Birmingham M.D.   On: 07/19/2024 15:12    Labs:  CBC: Recent Labs    07/19/24 1311 07/20/24 1001 07/21/24 0311  WBC 13.5* 9.9 10.0  HGB 10.9* 10.8* 11.0*  HCT 32.2* 32.9* 32.6*  PLT 326 229 263    COAGS: No results for input(s): INR, APTT in the last 8760 hours.  BMP: Recent Labs    07/19/24 1311 07/20/24 1001 07/21/24 0311  NA 135 134* 137  K 3.6 3.9 3.6  CL 98 102 104  CO2 23 23 24   GLUCOSE 123* 116* 108*  BUN 16 11 9   CALCIUM 9.8 8.7* 8.8*  CREATININE 1.04 0.90 0.95  GFRNONAA >60 >60 >60    LIVER  FUNCTION TESTS: Recent Labs    07/19/24 1311 07/20/24 1001 07/21/24 0311  BILITOT 1.3* 1.6* 1.6*  AST 17 16 14*  ALT 15 15 15   ALKPHOS 48 34* 34*  PROT 7.1 6.2* 6.3*  ALBUMIN 4.3 3.2* 3.1*    Assessment and Plan:  Splenic embolization by Dr. Karalee for splenic artery aneurysm rupture, expected result splenic infarction to be 30-50% - Right  CFA dressing may be removed today, no concerns with puncture site (soft, nontender, no palpable mass, minimal bruising). No stitch removal needed, Celt device used.  - Hemoglobin remained stable this morning. Mildly tachy but improved from presentation.  - He endorses minimal fatigue, otherwise no concern for postembolization syndrome.  - Dr. Jacquelin procedural note states the following for plan: Please expect post embolization syndrome in the days following the procedure. The patient is likely to experience left upper quadrant pain, referred pain to the left scapula as well as malaise, leukocytosis and potentially low-grade fever. A reactive left-sided pleural effusion is also a possibility. These effects can be managed conservatively with NSAIDs, and a steroid taper if necessary as well as narcotic pain medications.  Follow-up in IR clinic with repeat CT arteriogram of the abdomen and pelvis in January.  IR clinic follow up ordered with preference to be [facility] over [facility] clinic per patient.   Electronically Signed: Laymon Coast, NP 07/21/2024, 10:49 AM   I spent a total of 15 Minutes at the the patient's bedside AND on the patient's hospital floor or unit, greater than 50% of which was counseling/coordinating care for s/p splenic artery embolization.

## 2024-07-21 NOTE — Progress Notes (Signed)
 Mobility Specialist Progress Note:    07/21/24 1400  Mobility  Activity Ambulated independently  Level of Assistance Independent  Assistive Device None  Distance Ambulated (ft) 120 ft  Activity Response Tolerated well  Mobility Referral Yes  Mobility visit 1 Mobility  Mobility Specialist Start Time (ACUTE ONLY) 1400  Mobility Specialist Stop Time (ACUTE ONLY) 1408  Mobility Specialist Time Calculation (min) (ACUTE ONLY) 8 min   Pt received in bed, resting HR 96 bpm. Sitting EOB, HR increased to 120 bpm. Ambulated in hallway, no physical assistance required. Max HR 156 bpm. Returned to room, all needs met, RN notified.   Dynesha Woolen Mobility Specialist Please contact via Special educational needs teacher or  Rehab office at [phone]

## 2024-07-21 NOTE — Discharge Summary (Signed)
 Physician Discharge Summary  Marcus Scott FMW:[id number] DOB: [birth year] DOA: 07/19/2024  PCP: Patient, No Pcp Per  Admit date: 07/19/2024 Discharge date: 07/21/24  Admitted From: Home Disposition: Home Recommendations for Outpatient Follow-up:  Cardiology and interventional radiology to arrange outpatient follow-up Recommend outpatient sleep study Check blood pressure, CMP and CBC at follow-up Please follow up on the following pending results: None  Home Health: No need identified Equipment/Devices: No need identified  Discharge Condition: Stable CODE STATUS: Full code Diet Orders (From admission, onward)     Start     Ordered   07/19/24 2034  Diet regular Room service appropriate? Yes; Fluid consistency: Thin  Diet effective now       Question Answer Comment  Room service appropriate? 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   In ED, in RVR to 150s.  Hgb 10.8.  WBC 13.5.  proBNP 832.  Troponin 30.  EKG confirmed A-fib with rate 150 and LAD.  CT concerning for ruptured splenic artery aneurysm.  Surgery, IR and PCCM consulted.  Rate control achieved after IV Lopressor with HR improving to 110s.   Patient underwent successful coil embolization of large splenic artery aneurysm.  Abdominal pain resolved.  Cleared for discharge by interventional radiology.    In regards to A-fib, HR improved to 90s at rest but elevated to 120s with exertion.  Patient was asymptomatic.  Echocardiogram with normal LVEF but RWMA and enlarged LAE.  Cleared for discharge by cardiology for outpatient follow-up on metoprolol 50 mg 3 times daily.  Cardiology to arrange heart monitor.   Patient's blood pressure was elevated partly due to his  anxiety and hospital stay.  He has been very restless and eager to go home.  He will resume home Benicar and HCTZ in addition to metoprolol as above.  Cardiology to arrange close follow-up.  See individual problem list below for more.   Problems addressed during this hospitalization Ruptured splenic artery aneurysm  - S/p coil embolization of large splenic artery aneurysm  - H&H stable at 10.8 but unknown baseline. - IR to arrange outpatient follow-up with repeat CT angio abdomen and pelvis   New onset A-fib with RVR: HR in 90s at rest but as high as 140s with minimal activities but asymptomatic.  CHA2DS2-VASc score 1 for HTN.  TSH normal.  TTE with normal LVEF but RWMA and severely dilated left atrium.  -Need for discharge by cardiology on metoprolol 50 mg 3 times daily -Per cardiology, not a candidate for anticoagulation due to risk of bleeding - Cardiology to arrange heart monitor and outpatient follow-up   Essential hypertension: BP elevated partly due to hospital stay, anxiety, sleepless nights and holding home antihypertensive meds.  He might have whitecoat hypertension as well -Metoprolol as above - Resume  home Benicar and HCTZ.   Normocytic anemia: Unknown baseline.  Stable. - Recheck CBC at follow-up   Elevated troponin:: Mild.  Likely due to A-fib.  No chest pain.  TTE with RWMA. - Outpatient evaluation by cardiology   Hyperbilirubinemia: Mild.  Could be due to #1 - Recheck CMP at follow-up.   Morbid obesity Body mass index is 43.09 kg/m.  Consultations: General Surgery Critical care Interventional radiology Cardiology  Time spent 35  minutes  Vital signs Vitals:   07/21/24 0807 07/21/24 1604 07/21/24 1605 07/21/24 1607  BP: (!) 168/103 (!) 154/101 (!) 161/114 (!) 163/124  Pulse: (!) 101     Temp: 98.1 F (36.7 C)     Resp: 20 (!) 21 19 (!) 24  Weight:      SpO2: 96%     TempSrc: Oral        Discharge exam  GENERAL: No apparent distress.   Nontoxic. HEENT: MMM.  Vision and hearing grossly intact.  NECK: Supple.  No apparent JVD.  RESP:  No IWOB.  Fair aeration bilaterally. CVS:  RRR. Heart sounds normal.  ABD/GI/GU: BS+. Abd soft, NTND.  MSK/EXT:  Moves extremities. No apparent deformity. No edema.  SKIN: no apparent skin lesion or wound NEURO: Awake and alert. Oriented appropriately.  No apparent focal neuro deficit. PSYCH: Calm. Normal affect.   Discharge Instructions Discharge Instructions     Discharge instructions   Complete by: As directed    It has been a pleasure taking care of you!  You were hospitalized with abdominal pain due to ruptured splenic artery aneurysm (blood vessels to your spleen) and atrial fibrillation (fast and irregular heartbeat).  You have been treated surgically medically.  We are discharging you on medication for atrial fibrillation per recommendation by cardiology.  Follow-up with cardiology and interventional radiology per their recommendation.   Take care,   Increase activity slowly   Complete by: As directed    No wound care   Complete by: As directed       Allergies as of 07/21/2024   No Known Allergies      Medication List     STOP taking these medications    oxyCODONE  5 MG immediate release tablet Commonly known as: Roxicodone    oxyCODONE -acetaminophen  5-325 MG tablet Commonly known as: Percocet       TAKE these medications    ascorbic acid 500 MG tablet Commonly known as: VITAMIN C Take 500 mg by mouth daily.   hydrochlorothiazide 12.5 MG tablet Commonly known as: HYDRODIURIL Take 12.5 mg by mouth daily.   meclizine 25 MG tablet Commonly known as: ANTIVERT Take 25 mg by mouth 2 (two) times daily as needed for dizziness.   metoprolol tartrate 50 MG tablet Commonly known as: LOPRESSOR Take 1 tablet (50 mg total) by mouth 3 (three) times daily.   olmesartan 5 MG tablet Commonly known as: BENICAR Take 10 mg by mouth daily.   Vitamin D  (Cholecalciferol) 25 MCG (1000 UT) Caps Take by mouth.   zinc gluconate 50 MG tablet Take 50 mg by mouth daily.         Procedures/Studies: 10/18-coil embolization of large splenic artery aneurysm    ECHOCARDIOGRAM COMPLETE Result Date: 07/21/2024    ECHOCARDIOGRAM REPORT   Patient Name:   Marcus Scott Date of Exam: 07/21/2024 Medical Rec #:  [id number]        Height:       66.0 in Accession #:    [id number]       Weight:       267.0 lb Date of Birth:  Feb 16, 1961        BSA:          2.261 m Patient Age:    62 years         BP:           137/89 mmHg Patient Gender:  M                HR:           101 bpm. Exam Location:  Inpatient Procedure: 2D Echo, Cardiac Doppler, Color Doppler and Intracardiac            Opacification Agent (Both Spectral and Color Flow Doppler were            utilized during procedure). Indications:    Atrial fibrillation I48.91  History:        Patient has no prior history of Echocardiogram examinations.                 Arrythmias:Atrial Fibrillation; Risk Factors:Hypertension.  Sonographer:    BERNARDA ROCKS Referring Phys: [id number] TIMOTHY S OPYD IMPRESSIONS  1. Left ventricular ejection fraction, by estimation, is 55 to 60%. The left ventricle has normal function. The left ventricle demonstrates regional wall motion abnormalities (see scoring diagram/findings for description). The left ventricular internal cavity size was mildly to moderately dilated. Left ventricular diastolic parameters are indeterminate.  2. Right ventricular systolic function is mildly reduced. The right ventricular size is mildly enlarged. There is normal pulmonary artery systolic pressure.  3. Left atrial size was severely dilated.  4. The mitral valve is normal in structure. Trivial mitral valve regurgitation. No evidence of mitral stenosis.  5. The aortic valve is tricuspid. Aortic valve regurgitation is mild. Aortic valve sclerosis is present, with no evidence of aortic valve stenosis.  6. There is  mild dilatation of the ascending aorta, measuring 41 mm.  7. The inferior vena cava is dilated in size with <50% respiratory variability, suggesting right atrial pressure of 15 mmHg. Comparison(s): No prior Echocardiogram. FINDINGS  Left Ventricle: Left ventricular ejection fraction, by estimation, is 55 to 60%. The left ventricle has normal function. The left ventricle demonstrates regional wall motion abnormalities. Definity contrast agent was given IV to delineate the left ventricular endocardial borders. The left ventricular internal cavity size was mildly to moderately dilated. There is borderline left ventricular hypertrophy. Left ventricular diastolic function could not be evaluated due to atrial fibrillation. Left ventricular diastolic parameters are indeterminate.  LV Wall Scoring: The posterior wall is hypokinetic. Right Ventricle: The right ventricular size is mildly enlarged. Right vetricular wall thickness was not well visualized. Right ventricular systolic function is mildly reduced. There is normal pulmonary artery systolic pressure. The tricuspid regurgitant velocity is 1.82 m/s, and with an assumed right atrial pressure of 15 mmHg, the estimated right ventricular systolic pressure is 28.2 mmHg. Left Atrium: Left atrial size was severely dilated. Right Atrium: Right atrial size was normal in size. Pericardium: There is no evidence of pericardial effusion. Mitral Valve: The mitral valve is normal in structure. Trivial mitral valve regurgitation. No evidence of mitral valve stenosis. MV peak gradient, 5.5 mmHg. The mean mitral valve gradient is 3.0 mmHg. Tricuspid Valve: The tricuspid valve is normal in structure. Tricuspid valve regurgitation is trivial. No evidence of tricuspid stenosis. Aortic Valve: The aortic valve is tricuspid. Aortic valve regurgitation is mild. Aortic valve sclerosis is present, with no evidence of aortic valve stenosis. Aortic valve mean gradient measures 3.0 mmHg. Aortic  valve peak gradient measures 6.2 mmHg. Aortic valve area, by VTI measures 5.23 cm. Pulmonic Valve: The pulmonic valve was normal in structure. Pulmonic valve regurgitation is not visualized. No evidence of pulmonic stenosis. Aorta: The aortic root is normal in size and structure. There is mild dilatation of the ascending aorta, measuring 41 mm.  Venous: The inferior vena cava is dilated in size with less than 50% respiratory variability, suggesting right atrial pressure of 15 mmHg. IAS/Shunts: No atrial level shunt detected by color flow Doppler.  LEFT VENTRICLE PLAX 2D LVIDd:         6.40 cm      Diastology LVIDs:         5.40 cm      LV e' medial:    9.25 cm/s LV PW:         1.10 cm      LV E/e' medial:  8.1 LV IVS:        1.30 cm      LV e' lateral:   3.37 cm/s LVOT diam:     2.70 cm      LV E/e' lateral: 22.3 LV SV:         96 LV SV Index:   43 LVOT Area:     5.73 cm  LV Volumes (MOD) LV vol d, MOD A2C: 210.0 ml LV vol d, MOD A4C: 221.5 ml LV vol s, MOD A2C: 69.5 ml LV vol s, MOD A4C: 98.3 ml LV SV MOD A2C:     140.5 ml LV SV MOD A4C:     221.5 ml LV SV MOD BP:      131.5 ml RIGHT VENTRICLE             IVC RV Basal diam:  4.20 cm     IVC diam: 2.40 cm RV S prime:     15.50 cm/s TAPSE (M-mode): 1.5 cm RVSP:           21.2 mmHg LEFT ATRIUM              Index        RIGHT ATRIUM           Index LA diam:        3.60 cm  1.59 cm/m   RA Pressure: 8.00 mmHg LA Vol (A2C):   72.0 ml  31.84 ml/m  RA Area:     16.50 cm LA Vol (A4C):   138.0 ml 61.03 ml/m  RA Volume:   43.40 ml  19.20 ml/m LA Biplane Vol: 105.0 ml 46.44 ml/m  AORTIC VALVE                    PULMONIC VALVE AV Area (Vmax):    4.24 cm     PV Vmax:          0.74 m/s AV Area (Vmean):   4.22 cm     PV Peak grad:     2.2 mmHg AV Area (VTI):     5.23 cm     PR End Diast Vel: 1.79 msec AV Vmax:           125.00 cm/s AV Vmean:          78.200 cm/s AV VTI:            0.184 m AV Peak Grad:      6.2 mmHg AV Mean Grad:      3.0 mmHg LVOT Vmax:         92.50  cm/s LVOT Vmean:        57.600 cm/s LVOT VTI:          0.168 m LVOT/AV VTI ratio: 0.91  AORTA Ao Root diam: 3.60 cm Ao Asc diam:  4.10 cm MITRAL VALVE  TRICUSPID VALVE MV Area (PHT): 4.29 cm    TR Peak grad:   13.2 mmHg MV Area VTI:   4.46 cm    TR Vmax:        182.00 cm/s MV Peak grad:  5.5 mmHg    Estimated RAP:  8.00 mmHg MV Mean grad:  3.0 mmHg    RVSP:           21.2 mmHg MV Vmax:       1.17 m/s MV Vmean:      81.3 cm/s   SHUNTS MV Decel Time: 177 msec    Systemic VTI:  0.17 m MR Peak grad: 44.1 mmHg    Systemic Diam: 2.70 cm MR Vmax:      332.00 cm/s MV E velocity: 75.30 cm/s MV A velocity: 66.10 cm/s MV E/A ratio:  1.14 Emeline Calender Electronically signed by Emeline Calender Signature Date/Time: 07/21/2024/3:08:30 PM    Final    IR Angiogram Visceral Selective Result Date: 07/20/2024 INDICATION: 63 year old male transferred emergently [facility] [place] [facility] [place] after suffering a sentinel bleed from a ruptured splenic artery aneurysm. Upon arrival, it was deemed that the patient was stable enough to undergo endovascular repair rather than emergent laparotomy and splenectomy. 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 EXAM: SELECTIVE VISCERAL ARTERIOGRAPHY; IR EMBO ART VEN HEMORR LYMPH EXTRAV INC GUIDE ROADMAPPING; ADDITIONAL ARTERIOGRAPHY; IR ULTRASOUND GUIDANCE VASC ACCESS RIGHT MEDICATIONS: Metoprolol was administered intravenously by the Radiology nurse in 5 mg aliquots until heart rate was normalized. A total of 10 mg was administered. Following the procedure, 30 mg of Toradol was administered intramuscular by the radiology nurse. ANESTHESIA/SEDATION: Moderate (conscious) sedation was employed during this procedure. A total of Versed  4 mg and Fentanyl  200 mcg was administered intravenously. Moderate Sedation Time: 85 minutes. The patient's level of consciousness and  vital signs were monitored continuously by radiology nursing throughout the procedure under my direct supervision. CONTRAST:  80mL OMNIPAQUE IOHEXOL 300 MG/ML  SOLN FLUOROSCOPY: Radiation Exposure Index (as provided by the fluoroscopic device): 2180 mGy Kerma COMPLICATIONS: None immediate. PROCEDURE: Informed consent was obtained from the patient following explanation of the procedure, risks, benefits and alternatives. The patient understands, agrees and consents for the procedure. All questions were addressed. A time out was performed prior to the initiation of the procedure. Maximal barrier sterile technique utilized including caps, mask, sterile gowns, sterile gloves, large sterile drape, hand hygiene, and Betadine prep. The right common femoral artery was interrogated with ultrasound and found to be widely patent. An image was obtained and stored for the medical record. Local anesthesia was attained by infiltration with 1% lidocaine . A small dermatotomy was made. Under real-time sonographic guidance, the vessel was punctured with a 21 gauge micropuncture needle. Using standard technique, the initial micro needle was exchanged over a 0.018 micro wire for a transitional 4 [place] micro sheath. The micro sheath was then exchanged over a 0.035 wire for a 5 [place] vascular sheath A C2 cobra catheter was advanced over a Bentson wire and used to select the celiac artery. A limited celiac arteriogram was performed. Partial filling of the large splenic artery aneurysm is visualized distally. The proximal pathway of the splenic artery was identified. Utilizing a Glidewire, the C2 cobra catheter was advanced further into the splenic artery. Arteriography was then performed in multiple obliquities. The main splenic artery bifurcates into 2  main branches. There is a large 2.7 x 2.2 x 2.3 cm aneurysm arising from 1 of the code dominant branches. No evidence of active bleeding at the time of arteriography. At this time, the  decision was made to change 5 French catheter plaque forms. Therefore, the initial 65 cm cobra catheter was exchanged over a Rosen wire for a 100 cm angled glide catheter. The glide catheter was then further navigated over a Glidewire into the first of the splenic artery branches. Arteriography was performed. No aneurysm arising from this branch. There is excellent opacification of at least 30-50% of the splenic volume. The catheter was next advanced into the other branch of the splenic artery. 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 This branch supplies at least 50% of the splenic parenchyma. Unfortunately, given the anatomy of the aneurysm and the fact that it has already recently ruptured, preservation of this branch of the splenic artery is not possible. Therefore, a lantern microcatheter was introduced through the 5 [place] base catheter over a Fathom 16 wire. The microcatheter was carefully advanced through the aneurysm and into the outflow artery. Coil embolization was then performed using a series of penumbra detachable microcoils. Once the outflow artery was successfully embolized, several framing coils were placed in the large aneurysm sac in order to attain stability and provide a scaffold to form coils in the inflow artery. The microcatheter was brought back to the neck of the aneurysm and additional coil embolization was performed of the inflow artery. A completely occlusive metal coil pack was successfully deployed. Follow-up angiography confirms no further filling of the aneurysm and successful preservation of the non aneurysmal branch of the splenic artery. The catheters were removed. The access site was closed with a Celt arterial closure device. IMPRESSION: 1. 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 2. Successful coil embolization of the splenic artery. It was necessary to sacrifice the affected branch of the splenic artery which will likely  result in a splenic infarction of between 30 and 50% of the overall splenic volume. PLAN: Please expect post embolization syndrome in the days following the procedure. The patient is likely to experience left upper quadrant pain, referred pain to the left scapula as well as malaise, leukocytosis and potentially low-grade fever. A reactive left-sided pleural effusion is also a possibility. These effects can be managed conservatively with NSAIDs, and a steroid taper if necessary as well as narcotic pain medications. Follow-up in IR clinic with repeat CT arteriogram of the abdomen and pelvis in January. Electronically Signed   By: Wilkie Lent M.D.   On: 07/20/2024 07:27   IR US  Guide Vasc Access Right Result Date: 07/20/2024 INDICATION: 63 year old male transferred emergently from [facility] [place] [facility] after suffering a sentinel bleed from a ruptured splenic artery aneurysm. Upon arrival, it was deemed that the patient was stable enough to undergo endovascular repair rather than emergent laparotomy and splenectomy. 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 EXAM: SELECTIVE VISCERAL ARTERIOGRAPHY; IR EMBO ART VEN HEMORR LYMPH EXTRAV INC GUIDE ROADMAPPING; ADDITIONAL ARTERIOGRAPHY; IR ULTRASOUND GUIDANCE VASC ACCESS RIGHT MEDICATIONS: Metoprolol was administered intravenously by the Radiology nurse in 5 mg aliquots until heart rate was normalized. A total of 10 mg was administered. Following the procedure, 30 mg of Toradol was administered intramuscular by the radiology nurse. ANESTHESIA/SEDATION: Moderate (conscious)  sedation was employed during this procedure. A total of Versed  4 mg and Fentanyl  200 mcg was administered intravenously. Moderate Sedation Time: 85 minutes. The patient's level of consciousness and vital signs were monitored continuously by radiology nursing throughout the  procedure under my direct supervision. CONTRAST:  80mL OMNIPAQUE IOHEXOL 300 MG/ML  SOLN FLUOROSCOPY: Radiation Exposure Index (as provided by the fluoroscopic device): 2180 mGy Kerma COMPLICATIONS: None immediate. PROCEDURE: Informed consent was obtained from the patient following explanation of the procedure, risks, benefits and alternatives. The patient understands, agrees and consents for the procedure. All questions were addressed. A time out was performed prior to the initiation of the procedure. Maximal barrier sterile technique utilized including caps, mask, sterile gowns, sterile gloves, large sterile drape, hand hygiene, and Betadine prep. The right common femoral artery was interrogated with ultrasound and found to be widely patent. An image was obtained and stored for the medical record. Local anesthesia was attained by infiltration with 1% lidocaine . A small dermatotomy was made. Under real-time sonographic guidance, the vessel was punctured with a 21 gauge micropuncture needle. Using standard technique, the initial micro needle was exchanged over a 0.018 micro wire for a transitional 4 [place] micro sheath. The micro sheath was then exchanged over a 0.035 wire for a 5 [place] vascular sheath A C2 cobra catheter was advanced over a Bentson wire and used to select the celiac artery. A limited celiac arteriogram was performed. Partial filling of the large splenic artery aneurysm is visualized distally. The proximal pathway of the splenic artery was identified. Utilizing a Glidewire, the C2 cobra catheter was advanced further into the splenic artery. Arteriography was then performed in multiple obliquities. The main splenic artery bifurcates into 2 main branches. There is a large 2.7 x 2.2 x 2.3 cm aneurysm arising from 1 of the code dominant branches. No evidence of active bleeding at the time of arteriography. At this time, the decision was made to change 5 French catheter plaque forms. Therefore, the  initial 65 cm cobra catheter was exchanged over a Rosen wire for a 100 cm angled glide catheter. The glide catheter was then further navigated over a Glidewire into the first of the splenic artery branches. Arteriography was performed. No aneurysm arising from this branch. There is excellent opacification of at least 30-50% of the splenic volume. The catheter was next advanced into the other branch of the splenic artery. 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 This branch supplies at least 50% of the splenic parenchyma. Unfortunately, given the anatomy of the aneurysm and the fact that it has already recently ruptured, preservation of this branch of the splenic artery is not possible. Therefore, a lantern microcatheter was introduced through the 5 [place] base catheter over a Fathom 16 wire. The microcatheter was carefully advanced through the aneurysm and into the outflow artery. Coil embolization was then performed using a series of penumbra detachable microcoils. Once the outflow artery was successfully embolized, several framing coils were placed in the large aneurysm sac in order to attain stability and provide a scaffold to form coils in the inflow artery. The microcatheter was brought back to the neck of the aneurysm and additional coil embolization was performed of the inflow artery. A completely occlusive metal coil pack was successfully deployed. Follow-up angiography confirms no further filling of the aneurysm and successful preservation of the non aneurysmal branch of the splenic artery. The catheters were removed. The access site was closed with a Celt arterial closure device. IMPRESSION: 1.  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 2. Successful coil embolization of the splenic artery. It was necessary to sacrifice the affected branch of the splenic artery which will likely result in a splenic infarction of between 30 and 50% of the overall splenic  volume. PLAN: Please expect post embolization syndrome in the days following the procedure. The patient is likely to experience left upper quadrant pain, referred pain to the left scapula as well as malaise, leukocytosis and potentially low-grade fever. A reactive left-sided pleural effusion is also a possibility. These effects can be managed conservatively with NSAIDs, and a steroid taper if necessary as well as narcotic pain medications. Follow-up in IR clinic with repeat CT arteriogram of the abdomen and pelvis in January. Electronically Signed   By: Wilkie Lent M.D.   On: 07/20/2024 07:27   IR Angiogram Selective Each Additional Vessel Result Date: 07/20/2024 INDICATION: 63 year old male transferred emergently from [facility] [place] to [facility] after suffering a sentinel bleed from a ruptured splenic artery aneurysm. Upon arrival, it was deemed that the patient was stable enough to undergo endovascular repair rather than emergent laparotomy and splenectomy. 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 EXAM: SELECTIVE VISCERAL ARTERIOGRAPHY; IR EMBO ART VEN HEMORR LYMPH EXTRAV INC GUIDE ROADMAPPING; ADDITIONAL ARTERIOGRAPHY; IR ULTRASOUND GUIDANCE VASC ACCESS RIGHT MEDICATIONS: Metoprolol was administered intravenously by the Radiology nurse in 5 mg aliquots until heart rate was normalized. A total of 10 mg was administered. Following the procedure, 30 mg of Toradol was administered intramuscular by the radiology nurse. ANESTHESIA/SEDATION: Moderate (conscious) sedation was employed during this procedure. A total of Versed  4 mg and Fentanyl  200 mcg was administered intravenously. Moderate Sedation Time: 85 minutes. The patient's level of consciousness and vital signs were monitored continuously by radiology nursing throughout the procedure under my direct supervision. CONTRAST:  80mL  OMNIPAQUE IOHEXOL 300 MG/ML  SOLN FLUOROSCOPY: Radiation Exposure Index (as provided by the fluoroscopic device): 2180 mGy Kerma COMPLICATIONS: None immediate. PROCEDURE: Informed consent was obtained from the patient following explanation of the procedure, risks, benefits and alternatives. The patient understands, agrees and consents for the procedure. All questions were addressed. A time out was performed prior to the initiation of the procedure. Maximal barrier sterile technique utilized including caps, mask, sterile gowns, sterile gloves, large sterile drape, hand hygiene, and Betadine prep. The right common femoral artery was interrogated with ultrasound and found to be widely patent. An image was obtained and stored for the medical record. Local anesthesia was attained by infiltration with 1% lidocaine . A small dermatotomy was made. Under real-time sonographic guidance, the vessel was punctured with a 21 gauge micropuncture needle. Using standard technique, the initial micro needle was exchanged over a 0.018 micro wire for a transitional 4 [place] micro sheath. The micro sheath was then exchanged over a 0.035 wire for a 5 [place] vascular sheath A C2 cobra catheter was advanced over a Bentson wire and used to select the celiac artery. A limited celiac arteriogram was performed. Partial filling of the large splenic artery aneurysm is visualized distally. The proximal pathway of the splenic artery was identified. Utilizing a Glidewire, the C2 cobra catheter was advanced further into the splenic artery. Arteriography was then performed in multiple obliquities. The main splenic artery bifurcates into 2 main branches. There is a large 2.7 x 2.2 x 2.3 cm  aneurysm arising from 1 of the code dominant branches. No evidence of active bleeding at the time of arteriography. At this time, the decision was made to change 5 French catheter plaque forms. Therefore, the initial 65 cm cobra catheter was exchanged over a Rosen  wire for a 100 cm angled glide catheter. The glide catheter was then further navigated over a Glidewire into the first of the splenic artery branches. Arteriography was performed. No aneurysm arising from this branch. There is excellent opacification of at least 30-50% of the splenic volume. The catheter was next advanced into the other branch of the splenic artery. 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 This branch supplies at least 50% of the splenic parenchyma. Unfortunately, given the anatomy of the aneurysm and the fact that it has already recently ruptured, preservation of this branch of the splenic artery is not possible. Therefore, a lantern microcatheter was introduced through the 5 [place] base catheter over a Fathom 16 wire. The microcatheter was carefully advanced through the aneurysm and into the outflow artery. Coil embolization was then performed using a series of penumbra detachable microcoils. Once the outflow artery was successfully embolized, several framing coils were placed in the large aneurysm sac in order to attain stability and provide a scaffold to form coils in the inflow artery. The microcatheter was brought back to the neck of the aneurysm and additional coil embolization was performed of the inflow artery. A completely occlusive metal coil pack was successfully deployed. Follow-up angiography confirms no further filling of the aneurysm and successful preservation of the non aneurysmal branch of the splenic artery. The catheters were removed. The access site was closed with a Celt arterial closure device. IMPRESSION: 1. 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 2. Successful coil embolization of the splenic artery. It was necessary to sacrifice the affected branch of the splenic artery which will likely result in a splenic infarction of between 30 and 50% of the overall splenic volume. PLAN: Please expect post embolization syndrome  in the days following the procedure. The patient is likely to experience left upper quadrant pain, referred pain to the left scapula as well as malaise, leukocytosis and potentially low-grade fever. A reactive left-sided pleural effusion is also a possibility. These effects can be managed conservatively with NSAIDs, and a steroid taper if necessary as well as narcotic pain medications. Follow-up in IR clinic with repeat CT arteriogram of the abdomen and pelvis in January. Electronically Signed   By: Wilkie Lent M.D.   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 Upon arrival, it was deemed that the patient was stable enough to undergo endovascular repair rather than emergent laparotomy and splenectomy. 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 EXAM: SELECTIVE VISCERAL ARTERIOGRAPHY; IR EMBO ART VEN HEMORR LYMPH EXTRAV INC GUIDE ROADMAPPING; ADDITIONAL ARTERIOGRAPHY; IR ULTRASOUND GUIDANCE VASC ACCESS RIGHT MEDICATIONS: Metoprolol was administered intravenously by the Radiology nurse in 5 mg aliquots until heart rate was normalized. A total of 10 mg was administered. Following the procedure, 30 mg of Toradol was administered intramuscular by the radiology nurse. ANESTHESIA/SEDATION: Moderate (conscious) sedation was employed during this procedure. A total of Versed  4 mg  and Fentanyl  200 mcg was administered intravenously. Moderate Sedation Time: 85 minutes. The patient's level of consciousness and vital signs were monitored continuously by radiology nursing throughout the procedure under my direct supervision. CONTRAST:  80mL OMNIPAQUE IOHEXOL 300 MG/ML  SOLN FLUOROSCOPY: Radiation  Exposure Index (as provided by the fluoroscopic device): 2180 mGy Kerma COMPLICATIONS: None immediate. PROCEDURE: Informed consent was obtained from the patient following explanation of the procedure, risks, benefits and alternatives. The patient understands, agrees and consents for the procedure. All questions were addressed. A time out was performed prior to the initiation of the procedure. Maximal barrier sterile technique utilized including caps, mask, sterile gowns, sterile gloves, large sterile drape, hand hygiene, and Betadine prep. The right common femoral artery was interrogated with ultrasound and found to be widely patent. An image was obtained and stored for the medical record. Local anesthesia was attained by infiltration with 1% lidocaine . A small dermatotomy was made. Under real-time sonographic guidance, the vessel was punctured with a 21 gauge micropuncture needle. Using standard technique, the initial micro needle was exchanged over a 0.018 micro wire for a transitional 4 [place] micro sheath. The micro sheath was then exchanged over a 0.035 wire for a 5 [place] vascular sheath A C2 cobra catheter was advanced over a Bentson wire and used to select the celiac artery. A limited celiac arteriogram was performed. Partial filling of the large splenic artery aneurysm is visualized distally. The proximal pathway of the splenic artery was identified. Utilizing a Glidewire, the C2 cobra catheter was advanced further into the splenic artery. Arteriography was then performed in multiple obliquities. The main splenic artery bifurcates into 2 main branches. There is a large 2.7 x 2.2 x 2.3 cm aneurysm arising from 1 of the code dominant branches. No evidence of active bleeding at the time of arteriography. At this time, the decision was made to change 5 French catheter plaque forms. Therefore, the initial 65 cm cobra catheter was exchanged over a Rosen wire for a 100 cm angled glide catheter. The glide catheter  was then further navigated over a Glidewire into the first of the splenic artery branches. Arteriography was performed. No aneurysm arising from this branch. There is excellent opacification of at least 30-50% of the splenic volume. The catheter was next advanced into the other branch of the splenic artery. 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 This branch supplies at least 50% of the splenic parenchyma. Unfortunately, given the anatomy of the aneurysm and the fact that it has already recently ruptured, preservation of this branch of the splenic artery is not possible. Therefore, a lantern microcatheter was introduced through the 5 [place] base catheter over a Fathom 16 wire. The microcatheter was carefully advanced through the aneurysm and into the outflow artery. Coil embolization was then performed using a series of penumbra detachable microcoils. Once the outflow artery was successfully embolized, several framing coils were placed in the large aneurysm sac in order to attain stability and provide a scaffold to form coils in the inflow artery. The microcatheter was brought back to the neck of the aneurysm and additional coil embolization was performed of the inflow artery. A completely occlusive metal coil pack was successfully deployed. Follow-up angiography confirms no further filling of the aneurysm and successful preservation of the non aneurysmal branch of the splenic artery. The catheters were removed. The access site was closed with a Celt arterial closure device. IMPRESSION: 1. Large 2.7 cm aneurysm arising from a distal branch of the splenic  artery. 2. Successful coil embolization of the splenic artery. It was necessary to sacrifice the affected branch of the splenic artery which will likely result in a splenic infarction of between 30 and 50% of the overall splenic volume. PLAN: Please expect post embolization syndrome in the days following the procedure. The patient is likely to  experience left upper quadrant pain, referred pain to the left scapula as well as malaise, leukocytosis and potentially low-grade fever. A reactive left-sided pleural effusion is also a possibility. These effects can be managed conservatively with NSAIDs, and a steroid taper if necessary as well as narcotic pain medications. Follow-up in IR clinic with repeat CT arteriogram of the abdomen and pelvis in January. Electronically Signed   By: Wilkie Lent M.D.   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 Upon arrival, it was deemed that the patient was stable enough to undergo endovascular repair rather than emergent laparotomy and splenectomy. 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 EXAM: SELECTIVE VISCERAL ARTERIOGRAPHY; IR EMBO ART VEN HEMORR LYMPH EXTRAV INC GUIDE ROADMAPPING; ADDITIONAL ARTERIOGRAPHY; IR ULTRASOUND GUIDANCE VASC ACCESS RIGHT MEDICATIONS: Metoprolol was administered intravenously by the Radiology nurse in 5 mg aliquots until heart rate was normalized. A total of 10 mg was administered. Following the procedure, 30 mg of Toradol was administered intramuscular by the radiology nurse. ANESTHESIA/SEDATION: Moderate (conscious) sedation was employed during this procedure. A total of Versed  4 mg and Fentanyl  200 mcg was administered intravenously. Moderate Sedation Time: 85 minutes. The patient's level of consciousness and vital signs were monitored continuously by radiology nursing throughout the procedure under my direct supervision. CONTRAST:  80mL OMNIPAQUE IOHEXOL 300 MG/ML  SOLN FLUOROSCOPY: Radiation Exposure Index (as provided by the fluoroscopic  device): 2180 mGy Kerma COMPLICATIONS: None immediate. PROCEDURE: Informed consent was obtained from the patient following explanation of the procedure, risks, benefits and alternatives. The patient understands, agrees and consents for the procedure. All questions were addressed. A time out was performed prior to the initiation of the procedure. Maximal barrier sterile technique utilized including caps, mask, sterile gowns, sterile gloves, large sterile drape, hand hygiene, and Betadine prep. The right common femoral artery was interrogated with ultrasound and found to be widely patent. An image was obtained and stored for the medical record. Local anesthesia was attained by infiltration with 1% lidocaine . A small dermatotomy was made. Under real-time sonographic guidance, the vessel was punctured with a 21 gauge micropuncture needle. Using standard technique, the initial micro needle was exchanged over a 0.018 micro wire for a transitional 4 [place] micro sheath. The micro sheath was then exchanged over a 0.035 wire for a 5 [place] vascular sheath A C2 cobra catheter was advanced over a Bentson wire and used to select the celiac artery. A limited celiac arteriogram was performed. Partial filling of the large splenic artery aneurysm is visualized distally. The proximal pathway of the splenic artery was identified. Utilizing a Glidewire, the C2 cobra catheter was advanced further into the splenic artery. Arteriography was then performed in multiple obliquities. The main splenic artery bifurcates into 2 main branches. There is a large 2.7 x 2.2 x 2.3 cm aneurysm arising from 1 of the  code dominant branches. No evidence of active bleeding at the time of arteriography. At this time, the decision was made to change 5 French catheter plaque forms. Therefore, the initial 65 cm cobra catheter was exchanged over a Rosen wire for a 100 cm angled glide catheter. The glide catheter was then further navigated over a Glidewire into  the first of the splenic artery branches. Arteriography was performed. No aneurysm arising from this branch. There is excellent opacification of at least 30-50% of the splenic volume. The catheter was next advanced into the other branch of the splenic artery. 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 This branch supplies at least 50% of the splenic parenchyma. Unfortunately, given the anatomy of the aneurysm and the fact that it has already recently ruptured, preservation of this branch of the splenic artery is not possible. Therefore, a lantern microcatheter was introduced through the 5 [place] base catheter over a Fathom 16 wire. The microcatheter was carefully advanced through the aneurysm and into the outflow artery. Coil embolization was then performed using a series of penumbra detachable microcoils. Once the outflow artery was successfully embolized, several framing coils were placed in the large aneurysm sac in order to attain stability and provide a scaffold to form coils in the inflow artery. The microcatheter was brought back to the neck of the aneurysm and additional coil embolization was performed of the inflow artery. A completely occlusive metal coil pack was successfully deployed. Follow-up angiography confirms no further filling of the aneurysm and successful preservation of the non aneurysmal branch of the splenic artery. The catheters were removed. The access site was closed with a Celt arterial closure device. IMPRESSION: 1. 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 2. Successful coil embolization of the splenic artery. It was necessary to sacrifice the affected branch of the splenic artery which will likely result in a splenic infarction of between 30 and 50% of the overall splenic volume. PLAN: Please expect post embolization syndrome in the days following the procedure. The patient is likely to experience left upper quadrant pain, referred  pain to the left scapula as well as malaise, leukocytosis and potentially low-grade fever. A reactive left-sided pleural effusion is also a possibility. These effects can be managed conservatively with NSAIDs, and a steroid taper if necessary as well as narcotic pain medications. Follow-up in IR clinic with repeat CT arteriogram of the abdomen and pelvis in January. Electronically Signed   By: Wilkie Lent M.D.   On: 07/20/2024 07:27   CT ABDOMEN PELVIS W CONTRAST Result Date: 07/19/2024 CLINICAL DATA:  Bowel obstruction suspected. Pulmonary embolism suspected, high probability. EXAM: CT ANGIOGRAPHY CHEST CT ABDOMEN AND PELVIS WITH CONTRAST TECHNIQUE: Multidetector CT imaging of the chest was performed using the standard protocol during bolus administration of intravenous contrast. Multiplanar CT image reconstructions and MIPs were obtained to evaluate the vascular anatomy. Multidetector CT imaging of the abdomen and pelvis was performed using the standard protocol during bolus administration of intravenous contrast. RADIATION DOSE REDUCTION: This exam was performed according to the departmental dose-optimization program which includes automated exposure control, adjustment of the mA and/or kV according to patient size and/or use of iterative reconstruction technique. CONTRAST:  100mL OMNIPAQUE IOHEXOL 350 MG/ML SOLN COMPARISON:  None Available. FINDINGS: CTA CHEST FINDINGS Cardiovascular: The heart is enlarged and no significant pericardial effusion is seen. Scattered coronary artery calcifications are noted. The aorta is normal in caliber. The pulmonary trunk is mildly distended which may  be associated with underlying pulmonary artery hypertension. No definite pulmonary embolism is seen. Evaluation of the pulmonary arteries is limited due to mixing artifact and respiratory motion. Mediastinum/Nodes: No mediastinal, hilar, or axillary lymphadenopathy by size criteria. The thyroid gland, trachea, and  esophagus are within normal limits. Lungs/Pleura: There is a trace left pleural effusion. Multifocal strandy opacities and consolidation are seen at the lung bases bilaterally. No pneumothorax. Musculoskeletal: Degenerative changes are present in the thoracic spine. No acute osseous abnormality. Review of the MIP images confirms the above findings. CT ABDOMEN and PELVIS FINDINGS Hepatobiliary: No focal abnormality in the liver. No biliary ductal dilatation is seen. Multiple stones are present within the gallbladder. Pancreas: The pancreas is within normal limits. No pancreatic ductal dilatation. Spleen: Normal size. There is a wedge-shaped defect in the posterior aspect of the spleen with perisplenic complex free fluid extending into the left upper quadrant and about the greater curvature of the stomach. Adrenals/Urinary Tract: The adrenal glands are within normal limits. The kidneys enhance symmetrically. No renal calculus or hydronephrosis. The bladder is unremarkable. Stomach/Bowel: The stomach is not well delineated due to a complex fluid collection adjacent to the greater curvature and fundus measuring 15.4 x 7.3 x 7.7 cm. There is mild gastric wall thickening with Peri grass trick fat stranding. There is no bowel obstruction, free air, or pneumatosis. Scattered diverticula are noted along the colon without evidence of diverticulitis. Appendix appears normal. Vascular/Lymphatic: There is a hyperdense structure at the splenic hilum measuring 2.5 x 1.7 cm with surrounding hypodense rim measuring 4.0 x 2.9 cm, query splenic artery aneurysm. Examination is limited due timing of contrast bolus. Aorta is normal in caliber. Reproductive: Prostate gland is enlarged. Other: Complex free fluid is noted in the abdomen and pelvis a, likely originating in the left upper quadrant with attenuation most concerning for hemorrhage. A E fat containing umbilical hernia is present. There is a fat containing inguinal hernia on the  left. Musculoskeletal: Degenerative changes are present in the lumbar spine. No acute fracture is seen. Review of the MIP images confirms the above findings. IMPRESSION: 1. Large amount of complex free fluid in the abdomen and pelvis, predominantly centered in the left upper quadrant concerning for hemoperitoneum. 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 Interventional radiology consultation is recommended. 2. Wedge-shaped defect in the posterior aspect of the spleen with surrounding complex free fluid, differential considerations include infarct, laceration if there is history of trauma, or morphologic cleft. 3. Gastric wall thickening with adjacent hyperdense fluid collection and perigastric fat stranding, may be associated to local inflammatory changes. No free air is seen, however gastric ideology is not completely excluded. 4. No evidence of pulmonary embolism. 5. Trace left pleural effusion. 6. Strandy opacities and consolidation at the lung bases, possible atelectasis or infiltrate 7. Remaining incidental findings as described above. Critical Value/emergent results were called by telephone at the time of interpretation on 07/19/2024 at 3:05 pm to provider [facility] , who verbally acknowledged these results. Electronically Signed   By: Leita Birmingham M.D.   On: 07/19/2024 15:12   CT Angio Chest PE W and/or Wo Contrast Result Date: 07/19/2024 CLINICAL DATA:  Bowel obstruction suspected. Pulmonary embolism suspected, high probability. EXAM: CT ANGIOGRAPHY CHEST CT ABDOMEN AND PELVIS WITH CONTRAST TECHNIQUE: Multidetector CT imaging of the chest was performed using the standard protocol during bolus administration of intravenous contrast. Multiplanar  CT image reconstructions and MIPs were obtained to evaluate the vascular anatomy. Multidetector CT imaging of the abdomen and pelvis was  performed using the standard protocol during bolus administration of intravenous contrast. RADIATION DOSE REDUCTION: This exam was performed according to the departmental dose-optimization program which includes automated exposure control, adjustment of the mA and/or kV according to patient size and/or use of iterative reconstruction technique. CONTRAST:  100mL OMNIPAQUE IOHEXOL 350 MG/ML SOLN COMPARISON:  None Available. FINDINGS: CTA CHEST FINDINGS Cardiovascular: The heart is enlarged and no significant pericardial effusion is seen. Scattered coronary artery calcifications are noted. The aorta is normal in caliber. The pulmonary trunk is mildly distended which may be associated with underlying pulmonary artery hypertension. No definite pulmonary embolism is seen. Evaluation of the pulmonary arteries is limited due to mixing artifact and respiratory motion. Mediastinum/Nodes: No mediastinal, hilar, or axillary lymphadenopathy by size criteria. The thyroid gland, trachea, and esophagus are within normal limits. Lungs/Pleura: There is a trace left pleural effusion. Multifocal strandy opacities and consolidation are seen at the lung bases bilaterally. No pneumothorax. Musculoskeletal: Degenerative changes are present in the thoracic spine. No acute osseous abnormality. Review of the MIP images confirms the above findings. CT ABDOMEN and PELVIS FINDINGS Hepatobiliary: No focal abnormality in the liver. No biliary ductal dilatation is seen. Multiple stones are present within the gallbladder. Pancreas: The pancreas is within normal limits. No pancreatic ductal dilatation. Spleen: Normal size. There is a wedge-shaped defect in the posterior aspect of the spleen with perisplenic complex free fluid extending into the left upper quadrant and about the greater curvature of the stomach. Adrenals/Urinary Tract: The adrenal glands are within normal limits. The kidneys enhance symmetrically. No renal calculus or hydronephrosis.  The bladder is unremarkable. Stomach/Bowel: The stomach is not well delineated due to a complex fluid collection adjacent to the greater curvature and fundus measuring 15.4 x 7.3 x 7.7 cm. There is mild gastric wall thickening with Peri grass trick fat stranding. There is no bowel obstruction, free air, or pneumatosis. Scattered diverticula are noted along the colon without evidence of diverticulitis. Appendix appears normal. Vascular/Lymphatic: There is a hyperdense structure at the splenic hilum measuring 2.5 x 1.7 cm with surrounding hypodense rim measuring 4.0 x 2.9 cm, query splenic artery aneurysm. Examination is limited due timing of contrast bolus. Aorta is normal in caliber. Reproductive: Prostate gland is enlarged. Other: Complex free fluid is noted in the abdomen and pelvis a, likely originating in the left upper quadrant with attenuation most concerning for hemorrhage. A E fat containing umbilical hernia is present. There is a fat containing inguinal hernia on the left. Musculoskeletal: Degenerative changes are present in the lumbar spine. No acute fracture is seen. Review of the MIP images confirms the above findings. IMPRESSION: 1. Large amount of complex free fluid in the abdomen and pelvis, predominantly centered in the left upper quadrant concerning for hemoperitoneum. 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 Interventional radiology consultation is recommended. 2. Wedge-shaped defect in the posterior aspect of the spleen with surrounding complex free fluid, differential considerations include infarct, laceration if there is history of trauma, or morphologic cleft. 3. Gastric wall thickening with adjacent hyperdense fluid collection and perigastric fat stranding, may be associated to local inflammatory changes. No free air is seen, however gastric ideology is not completely  excluded. 4. No evidence of pulmonary embolism. 5. Trace left pleural effusion.  6. Strandy opacities and consolidation at the lung bases, possible atelectasis or infiltrate 7. Remaining incidental findings as described above. Critical Value/emergent results were called by telephone at the time of interpretation on 07/19/2024 at 3:05 pm to provider [facility] , who verbally acknowledged these results. Electronically Signed   By: Leita Birmingham M.D.   On: 07/19/2024 15:12       The results of significant diagnostics from this hospitalization (including imaging, microbiology, ancillary and laboratory) are listed below for reference.     Microbiology: Recent Results (from the past 240 hours)  Blood culture (routine x 2)     Status: None (Preliminary result)   Collection Time: 07/19/24  1:50 PM   Specimen: BLOOD LEFT ARM  Result Value Ref Range Status   Specimen Description   Final    BLOOD LEFT ARM Performed at [facility], [address] [place], [place] [address]    Special Requests   Final    BOTTLES DRAWN AEROBIC ONLY Blood Culture results may not be optimal due to an inadequate volume of blood received in culture bottles Performed at [facility], [address] [place], [place] [address]    Culture   Final    NO GROWTH 2 DAYS Performed at [facility], [address], [place], [place] [address]    Report Status PENDING  Incomplete  Culture, blood (Routine X 2) w Reflex to ID Panel     Status: None (Preliminary result)   Collection Time: 07/19/24 10:08 PM   Specimen: BLOOD RIGHT HAND  Result Value Ref Range Status   Specimen Description BLOOD RIGHT HAND  Final   Special Requests   Final    BOTTLES DRAWN AEROBIC ONLY Blood Culture results may not be optimal due to an inadequate volume of blood received in culture bottles   Culture   Final    NO GROWTH 2 DAYS Performed at [facility], [address] [place], [place]  [address]    Report Status PENDING  Incomplete     Labs:  CBC: Recent Labs  Lab 07/19/24 1311 07/20/24 1001 07/21/24 0311  WBC 13.5* 9.9 10.0  NEUTROABS 10.2*  --   --   HGB 10.9* 10.8* 11.0*  HCT 32.2* 32.9* 32.6*  MCV 84.7 88.0 86.5  PLT 326 229 263   BMP &GFR Recent Labs  Lab 07/19/24 1311 07/20/24 1001 07/21/24 0311  NA 135 134* 137  K 3.6 3.9 3.6  CL 98 102 104  CO2 23 23 24   GLUCOSE 123* 116* 108*  BUN 16 11 9   CREATININE 1.04 0.90 0.95  CALCIUM 9.8 8.7* 8.8*  MG  --  2.0 2.0   Estimated Creatinine Clearance: 101.4 mL/min (by C-G formula based on SCr of 0.95 mg/dL). Liver & Pancreas: Recent Labs  Lab 07/19/24 1311 07/20/24 1001 07/21/24 0311  AST 17 16 14*  ALT 15 15 15   ALKPHOS 48 34* 34*  BILITOT 1.3* 1.6* 1.6*  PROT 7.1 6.2* 6.3*  ALBUMIN 4.3 3.2* 3.1*   Recent Labs  Lab 07/19/24 1311  LIPASE 21   No results for input(s): AMMONIA in the last 168 hours. Diabetic: No results for input(s): HGBA1C in the last 72 hours. No results for input(s): GLUCAP in the last 168 hours. Cardiac Enzymes: No results for input(s): CKTOTAL, CKMB, CKMBINDEX, TROPONINI in the last 168 hours. Recent Labs    07/19/24 1311  PROBNP 832.0*   Coagulation Profile: No results for input(s): INR,  PROTIME in the last 168 hours. Thyroid Function Tests: Recent Labs    07/20/24 1001  TSH 2.074   Lipid Profile: No results for input(s): CHOL, HDL, LDLCALC, TRIG, CHOLHDL, LDLDIRECT in the last 72 hours. Anemia Panel: Recent Labs    07/20/24 1001 07/20/24 1412  VITAMINB12  --  682  FOLATE 8.8  --   FERRITIN 221  --   TIBC 307  --   IRON 20*  --   RETICCTPCT 2.1  --    Urine analysis:    Component Value Date/Time   COLORURINE YELLOW 07/19/2024 1340   APPEARANCEUR CLEAR 07/19/2024 1340   LABSPEC 1.035 (H) 07/19/2024 1340   PHURINE 6.5 07/19/2024 1340   GLUCOSEU NEGATIVE 07/19/2024 1340   HGBUR NEGATIVE 07/19/2024 1340    BILIRUBINUR NEGATIVE 07/19/2024 1340   KETONESUR NEGATIVE 07/19/2024 1340   PROTEINUR NEGATIVE 07/19/2024 1340   NITRITE NEGATIVE 07/19/2024 1340   LEUKOCYTESUR NEGATIVE 07/19/2024 1340   Sepsis Labs: Invalid input(s): PROCALCITONIN, LACTICIDVEN   SIGNED:  Mignon ONEIDA Bump, MD  [facility] 07/21/2024, 5:05 PM

## 2024-07-22 ENCOUNTER — Encounter (HOSPITAL_COMMUNITY): Payer: Self-pay | Admitting: *Deleted

## 2024-07-22 DIAGNOSIS — R400 Somnolence: Secondary | ICD-10-CM

## 2024-07-22 DIAGNOSIS — R0683 Snoring: Secondary | ICD-10-CM

## 2024-07-22 DIAGNOSIS — I4891 Unspecified atrial fibrillation: Secondary | ICD-10-CM

## 2024-07-23 NOTE — Telephone Encounter (Signed)
 He needs a home sleep study, please

## 2024-07-24 LAB — CULTURE, BLOOD (ROUTINE X 2)
Culture: NO GROWTH
Culture: NO GROWTH

## 2024-07-25 ENCOUNTER — Telehealth: Payer: Self-pay

## 2024-07-25 NOTE — Telephone Encounter (Signed)
**Note De-Identified Chaneka Trefz Obfuscation** Franchot Glade RAMAN, RN to [employer] [facility]  Francyne Headland, MD    07/23/24 11:06 AM During hospitalization recommendations from Dr. Francyne - pt following up on request. Forwarding to ordering office    Also recommend outpatient sleep study.  He has some daytime hypersomnolence, snores, is morbidly obese with a very large neck circumference.   STOP BANG score 5 (age, obesity, neck circumference, snoring, daytime somnolence)   Outpatient sleep study (we will arrange)

## 2024-07-25 NOTE — Telephone Encounter (Signed)
**Note De-Identified Dustee Bottenfield Obfuscation** We have no insurance on file for the pt so I called him to get his Insurance info or to ask if this study will be self paid.  The pts wife, Marcus Scott answer the phone and the pt was there with her and he did give me verbal permission to s/w with her.  Marcus Scott advised me that they will be paying for this Itamar-HST out of their pocket.  She states that she is coming to the [employer] office today to pick up a WatchPAT One-HST device and to get instructions on how to use it.  Per her request, I have sent the address for the [facility] office to them Marcus Scott the pts [facility].

## 2024-07-25 NOTE — Telephone Encounter (Addendum)
**Note De-Identified Yohance Hathorne Obfuscation** I have placed the order for a Itamar-HST.  We have no insurance on file for the pt so I called him to discuss and his wife, Darice answer my call. Per the pts wife, Darice (Pt gave me verbal permission to s/w her) the pt will be paying for this Itamar-HST out of his pocket. Darice states that she will come to the [employer] office today to pick up a WatchPAT One-HST device with instructions on how to use it.

## 2024-07-25 NOTE — Telephone Encounter (Signed)
**Note De-Identified Aaronjames Kelsay Obfuscation** Patient agreement reviewed and signed by the pts wife, Darice on 07/25/2024.  WatchPAT issued to patient on 07/25/2024 by Nasiah Lehenbauer, Avelina HERO, LPN. Patient aware to not open the WatchPAT box until contacted with the activation PIN. PIN # given to the pts wife Darice today. Patient profile initialized in Cloud[employer] on 07/25/2024 by Avelina Nayara Taplin, LPN. Device serial number: [id number]

## 2024-07-29 ENCOUNTER — Encounter (HOSPITAL_BASED_OUTPATIENT_CLINIC_OR_DEPARTMENT_OTHER): Payer: Self-pay | Admitting: Cardiology

## 2024-07-29 DIAGNOSIS — G4733 Obstructive sleep apnea (adult) (pediatric): Secondary | ICD-10-CM

## 2024-08-01 ENCOUNTER — Ambulatory Visit: Payer: Self-pay | Attending: Cardiovascular Disease

## 2024-08-01 DIAGNOSIS — R400 Somnolence: Secondary | ICD-10-CM

## 2024-08-01 DIAGNOSIS — R0683 Snoring: Secondary | ICD-10-CM

## 2024-08-01 DIAGNOSIS — I4891 Unspecified atrial fibrillation: Secondary | ICD-10-CM

## 2024-08-01 NOTE — Procedures (Signed)
   SLEEP STUDY REPORT Patient Information Study Date: 07/29/2024 Patient Name: Marcus Scott Patient ID: [id number] Birth Date: Jan 14, 1961 Age: 63 Gender: Male BMI: 41.8 (W=260 lb, H=5' 6'') Referring Physician: Jerel Balding, MD  TEST DESCRIPTION: Home sleep apnea testing was completed using the WatchPat, a Type 1 device, utilizing peripheral arterial tonometry (PAT), chest movement, actigraphy, pulse oximetry, pulse rate, body position and snore. AHI was calculated with apnea and hypopnea using valid sleep time as the denominator. RDI includes apneas, hypopneas, and RERAs. The data acquired and the scoring of sleep and all associated events were performed in accordance with the recommended standards and specifications as outlined in the AASM Manual for the Scoring of Sleep and Associated Events 2.2.0 (2015).  FINDINGS:  1. Moderate Obstructive Sleep Apnea with AHI 24.4/hr overall but Severe Obstructive Sleep Apnea in REM sleep with REM AHI 42.9/hr.  2. No Central Sleep Apnea with pAHIc 0.7/hr.  3. Oxygen desaturations as low as 71%.  4. Moderate to severe snoring was present. O2 sats were < 88% for 18.5 min.  5. Total sleep time was 6 hrs and 1 min.  6. 19% of total sleep time was spent in REM sleep.  7. Normal sleep onset latency at 10 min  8. Prolonged REM sleep onset latency at 213 min.  9. Total awakenings were 14. 10. Arrhythmia detection: Suggestive of possible brief atrial fibrillation lasting 1 minutes and 48 seconds. This is not diagnostic and further testing with outpatient telemetry monitoring is recommended.  DIAGNOSIS: Moderate Obstructive Sleep Apnea (G47.33) Nocturnal Hypoxemia Possible Atrial Fibrillation  RECOMMENDATIONS: 1. Clinical correlation of these findings is necessary. The decision to treat obstructive sleep apnea (OSA) is usually based on the presence of apnea symptoms or the presence of associated medical conditions such as  Hypertension, Congestive Heart Failure, Atrial Fibrillation or Obesity. The most common symptoms of OSA are snoring, gasping for breath while sleeping, daytime sleepiness and fatigue. 2. Initiating apnea therapy is recommended given the presence of symptoms and/or associated conditions. Recommend proceeding with one of the following:  a. Auto-CPAP therapy with a pressure range of 5-20cm H2O.  b. An oral appliance (OA) that can be obtained from certain dentists with expertise in sleep medicine. These are primarily of use in non-obese patients with mild and moderate disease.  c. An ENT consultation which may be useful to look for specific causes of obstruction and possible treatment options.  d. If patient is intolerant to PAP therapy, consider referral to ENT for evaluation for hypoglossal nerve stimulator. 3. Close follow-up is necessary to ensure success with CPAP or oral appliance therapy for maximum benefit . 4. A follow-up oximetry study on CPAP is recommended to assess the adequacy of therapy and determine the need for supplemental oxygen or the potential need for Bi-level therapy. An arterial blood gas to determine the adequacy of baseline ventilation and oxygenation should also be considered. 5. Healthy sleep recommendations include: adequate nightly sleep (normal 7-9 hrs/night), avoidance of caffeine after noon and alcohol near bedtime, and maintaining a sleep environment that is cool, dark and quiet. 6. Weight loss for overweight patients is recommended. Even modest amounts of weight loss can significantly improve the severity of sleep apnea. 7. Snoring recommendations include: weight loss where appropriate, side sleeping, and avoidance of alcohol before bed. 8. Operation of motor vehicle should not be performed when sleepy.  Signature: Wilbert Bihari, MD; [facility]; Diplomat, American Board of Sleep Medicine Electronically Signed: 08/01/2024 8:50:50 PM

## 2024-08-04 ENCOUNTER — Encounter: Payer: Self-pay | Admitting: Radiology

## 2024-08-05 ENCOUNTER — Inpatient Hospital Stay (HOSPITAL_COMMUNITY): Payer: Self-pay | Admitting: Physician Assistant

## 2024-08-08 ENCOUNTER — Ambulatory Visit: Payer: Self-pay | Admitting: Cardiovascular Disease

## 2024-08-12 ENCOUNTER — Telehealth: Payer: Self-pay | Admitting: Cardiovascular Disease

## 2024-08-12 ENCOUNTER — Telehealth: Payer: Self-pay | Admitting: Cardiology

## 2024-08-12 NOTE — Telephone Encounter (Signed)
Report is in chart

## 2024-08-12 NOTE — Telephone Encounter (Signed)
 Calling to report abnormal monitor results. Call transferred

## 2024-08-12 NOTE — Telephone Encounter (Signed)
 iRhythm calling with abnormal Zio results. Please advise. Attempted to transfer.   Reference #: [id number] Callback #: [phone]

## 2024-08-12 NOTE — Telephone Encounter (Signed)
 See other phone call from today.  Per I rhythm call back number is [phone]

## 2024-08-12 NOTE — Telephone Encounter (Signed)
 Received call transferred directly from operator and spoke with [facility]     Cardiac Monitor Alert  Date of alert:  08/12/2024   Patient Name: Marcus Scott  DOB: [birth year]  MRN: [id number]   [name] Cardiologist: None  [name] EP:  None    Monitor Information: Long Term Monitor [ZioXT]  Reason:  afib Ordering provider:  Thom Sluder, PA--Dr Waddell to read   Alert Atrial Fibrillation/Flutter This is the 1st alert for this rhythm.  The patient has a hx of Atrial Fibrillation/Flutter.  The patient is not currently on anticoagulation.  Next Cardiology Appointment   Date:  11/13  Provider:   Quita Kicks,   The patient could NOT be reached by telephone today.  Left message to call office The patient was referred to the Atrial Fibrillation Clinic.  Appt date/time:  11/13 at 1:30   Other: Per I rhythm patient had rapid afib -heart rate 179 for one minute on 11/3 at 3:49  Avelina Felix, RN  08/12/2024 9:41 AM

## 2024-08-12 NOTE — Telephone Encounter (Signed)
 Tried twice to return call to number provided.  No answer. No voicemail.  Will follow up with monitor team regarding any alerts.

## 2024-08-14 ENCOUNTER — Ambulatory Visit (HOSPITAL_COMMUNITY)
Admission: RE | Admit: 2024-08-14 | Discharge: 2024-08-14 | Disposition: A | Discharge status: Home / Self Care | Payer: Self-pay | Source: Ambulatory Visit | Attending: Physician Assistant | Admitting: Physician Assistant

## 2024-08-14 ENCOUNTER — Encounter (HOSPITAL_COMMUNITY): Payer: Self-pay | Admitting: *Deleted

## 2024-08-14 VITALS — BP 162/100 | HR 90 | Ht 66.0 in | Wt 255.4 lb

## 2024-08-14 DIAGNOSIS — I10 Essential (primary) hypertension: Secondary | ICD-10-CM

## 2024-08-14 DIAGNOSIS — I4891 Unspecified atrial fibrillation: Secondary | ICD-10-CM

## 2024-08-14 DIAGNOSIS — I4819 Other persistent atrial fibrillation: Secondary | ICD-10-CM

## 2024-08-14 MED ORDER — APIXABAN 5 MG PO TABS: 5.0000 mg | ORAL_TABLET | Freq: Two times a day (BID) | ORAL | 3 refills | Status: DC

## 2024-08-14 NOTE — Progress Notes (Signed)
 Primary Care Physician: Patient, No Pcp Per Primary Cardiologist: Jerel Balding, MD Electrophysiologist: Danelle Birmingham, MD  Referring Physician: Dr Balding Ozell Kussmaul is a 63 y.o. male with a history of HTN, OSA, atrial fibrillation who presents for follow up in the [facility]rial Fibrillation [facility].  The patient was initially diagnosed with atrial fibrillation 07/19/24 after presenting to the ED with symptoms of abdominal pain and swelling. 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 He was also found to be in rapid afib at the time. Anticoagulation was deferred given his recent abdominal bleeding. A monitor was ordered which showed 100% afib burden.     Patient presents today for follow up for atrial fibrillation. He remains in afib today. He had a sleep study which showed moderate OSA. He is fairly asymptomatic with his arrhythmia.   Today, he denies symptoms of palpitations, chest pain, shortness of breath, orthopnea, PND, lower extremity edema, dizziness, presyncope, syncope, bleeding, or neurologic sequela. The patient is tolerating medications without difficulties and is otherwise without complaint today.    Atrial Fibrillation Risk Factors:  he does have symptoms or diagnosis of sleep apnea. he does not have a history of rheumatic fever.   Atrial Fibrillation Management history:  Previous antiarrhythmic drugs: none Previous cardioversions: none Previous ablations: none Anticoagulation history: none  ROS- All systems are reviewed and negative except as per the HPI above.  Past Medical History:  Diagnosis Date   Hypertension    STATES BP HAS BEEN HIGH FOR QUIET SOME TIME, NO MEDS, NO PCP    Current Outpatient Medications  Medication Sig Dispense Refill   apixaban (ELIQUIS) 5 MG TABS tablet Take 1 tablet (5 mg total) by mouth 2 (two) times  daily. 60 tablet 3   ascorbic acid (VITAMIN C) 500 MG tablet Take 500 mg by mouth daily. (Patient taking differently: Take 500 mg by mouth as needed.)     metoprolol tartrate (LOPRESSOR) 50 MG tablet Take 1 tablet (50 mg total) by mouth 3 (three) times daily. 270 tablet 0   olmesartan (BENICAR) 5 MG tablet Take 10 mg by mouth daily.     zinc gluconate 50 MG tablet Take 50 mg by mouth daily. (Patient taking differently: Take 50 mg by mouth as needed.)     hydrochlorothiazide (HYDRODIURIL) 12.5 MG tablet Take 12.5 mg by mouth daily.     No current facility-administered medications for this encounter.    Physical Exam: BP (!) 162/100   Pulse 90   Ht 5' 6 (1.676 m)   Wt 115.8 kg   BMI 41.22 kg/m   GEN: Well nourished, well developed in no acute distress CARDIAC: Irregularly irregular rate and rhythm, no murmurs, rubs, gallops RESPIRATORY:  Clear to auscultation without rales, wheezing or rhonchi  ABDOMEN: Soft, non-tender, non-distended EXTREMITIES:  No edema; No deformity   Wt Readings from Last 3 Encounters:  08/14/24 115.8 kg  07/21/24 121.1 kg  01/24/24 120.7 kg     EKG today demonstrates  Afib Vent. rate 90 BPM PR interval * ms QRS duration 102 ms QT/QTcB 360/440 ms   Echo 07/21/24 demonstrated   1. Left ventricular ejection fraction, by estimation, is 55 to 60%. The  left ventricle has normal function. The left ventricle demonstrates  regional wall motion abnormalities (see scoring diagram/findings for  description). The left ventricular internal cavity size  was mildly to moderately dilated. Left ventricular diastolic  parameters are indeterminate.   2. Right ventricular systolic function is mildly reduced. The right  ventricular size is mildly enlarged. There is normal pulmonary artery  systolic pressure.   3. Left atrial size was severely dilated.   4. The mitral valve is normal in structure. Trivial mitral valve  regurgitation. No evidence of mitral stenosis.    5. The aortic valve is tricuspid. Aortic valve regurgitation is mild.  Aortic valve sclerosis is present, with no evidence of aortic valve  stenosis.   6. There is mild dilatation of the ascending aorta, measuring 41 mm.   7. The inferior vena cava is dilated in size with <50% respiratory  variability, suggesting right atrial pressure of 15 mmHg.    CHA2DS2-VASc Score = 1  The patient's score is based upon: CHF History: 0 HTN History: 1 Diabetes History: 0 Stroke History: 0 Vascular Disease History: 0 Age Score: 0 Gender Score: 0       ASSESSMENT AND PLAN: Persistent Atrial Fibrillation (ICD10:  I48.19) The patient's CHA2DS2-VASc score is 1, indicating a 0.6% annual risk of stroke.   Patient remains in rate controlled afib.  We discussed rhythm control options today. Will start Eliquis 5 mg BID and plan for DCCV after >3 weeks of anticoagulation.   Continue Lopressor 50 mg TID Check bmet/cbc today.   OSA  Encouraged nightly CPAP The importance of adequate treatment of sleep apnea was discussed today in order to improve our ability to maintain sinus rhythm long term.  HTN Elevated today. Will reassess in SR.   Follow up in the AF clinic post DCCV.    Informed Consent   Shared Decision Making/Informed Consent The risks (stroke, cardiac arrhythmias rarely resulting in the need for a temporary or permanent pacemaker, skin irritation or burns and complications associated with conscious sedation including aspiration, arrhythmia, respiratory failure and death), benefits (restoration of normal sinus rhythm) and alternatives of a direct current cardioversion were explained in detail to Mr. Townley and he agrees to proceed.       [facility] [address] [place],  [address] [phone]

## 2024-08-24 DIAGNOSIS — I4891 Unspecified atrial fibrillation: Secondary | ICD-10-CM

## 2024-08-26 ENCOUNTER — Ambulatory Visit: Payer: Self-pay | Admitting: Cardiology

## 2024-08-27 ENCOUNTER — Ambulatory Visit (HOSPITAL_COMMUNITY): Payer: Self-pay | Admitting: Physician Assistant

## 2024-08-27 LAB — BASIC METABOLIC PANEL WITH GFR
BUN/Creatinine Ratio: 24 (ref 10–24)
BUN: 23 mg/dL (ref 8–27)
CO2: 20 mmol/L (ref 20–29)
Calcium: 9.6 mg/dL (ref 8.6–10.2)
Chloride: 102 mmol/L (ref 96–106)
Creatinine, Ser: 0.96 mg/dL (ref 0.76–1.27)
Glucose: 95 mg/dL (ref 70–99)
Potassium: 4 mmol/L (ref 3.5–5.2)
Sodium: 140 mmol/L (ref 134–144)
eGFR: 90 mL/min/1.73 (ref >59)

## 2024-08-27 LAB — CBC
Hematocrit: 42.8 % (ref 37.5–51.0)
Hemoglobin: 13.9 g/dL (ref 13.0–17.7)
MCH: 28.1 pg (ref 26.6–33.0)
MCHC: 32.5 g/dL (ref 31.5–35.7)
MCV: 87 fL (ref 79–97)
Platelets: 231 x10E3/uL (ref 150–450)
RBC: 4.95 x10E6/uL (ref 4.14–5.80)
RDW: 14.3 % (ref 11.6–15.4)
WBC: 7.3 x10E3/uL (ref 3.4–10.8)

## 2024-09-04 NOTE — Progress Notes (Signed)
 Called patient with pre-procedure instructions for tomorrow.   Patient informed of:   Time to arrive for procedure (0830). Remain NPO past midnight.  Must have a ride home and a responsible adult to remain with them for 24 hours post procedure. Instructed to take am meds with sip of water and confirmed blood thinner consistency. Pt states he believes he missed 1 dose of Eliquis  1.5 weeks ago. Will contact provider for further instructions.  Patient reports understanding and no other questions at this time.

## 2024-09-04 NOTE — Progress Notes (Signed)
 Called patient phone number with pre-procedure instructions and got generic voicemail with no name given. Reason for calling and callback number was given for further instruction for tomorrow.

## 2024-09-05 ENCOUNTER — Other Ambulatory Visit (HOSPITAL_COMMUNITY): Payer: Self-pay | Admitting: *Deleted

## 2024-09-05 DIAGNOSIS — I4819 Other persistent atrial fibrillation: Secondary | ICD-10-CM

## 2024-09-08 ENCOUNTER — Telehealth: Payer: Self-pay | Admitting: *Deleted

## 2024-09-08 NOTE — Telephone Encounter (Signed)
-----   Message from Wilbert Bihari sent at 08/01/2024  8:51 PM EDT ----- Please let patient know that they have sleep apnea.  Recommend therapeutic CPAP titration for treatment of patient's sleep disordered breathing.

## 2024-09-08 NOTE — Telephone Encounter (Signed)
 The patient has been notified of the result and verbalized understanding.  All questions (if any) were answered. Joshua Dalton Seip, CMA 09/08/2024 2:31 PM     Patient has more health issues to deal with and wants to revisit the titration in next year. He will call us  back when he is ready.

## 2024-09-15 ENCOUNTER — Ambulatory Visit (HOSPITAL_COMMUNITY): Payer: Self-pay | Admitting: Physician Assistant

## 2024-09-23 DIAGNOSIS — I4819 Other persistent atrial fibrillation: Secondary | ICD-10-CM

## 2024-10-07 ENCOUNTER — Encounter (HOSPITAL_COMMUNITY): Payer: Self-pay

## 2024-10-07 ENCOUNTER — Ambulatory Visit (HOSPITAL_COMMUNITY): Payer: Self-pay | Admitting: Physician Assistant

## 2024-10-07 NOTE — Progress Notes (Signed)
 Patient Called with the Following Pre-Procedure instructions:   Time of Arrival for Procedure:0800  NPO at midnight prior to procedure day besides sips of water with morning medications  Patient must have a ride home and someone to stay with them for 24 hours post procedure  Instructed to take following medications morning prior to procedure: Eliquis 

## 2024-10-07 NOTE — Progress Notes (Incomplete)
 "   Primary Care Physician: Patient, No Pcp Per Primary Cardiologist: Jerel Balding, MD Electrophysiologist: Danelle Birmingham, MD  Referring Physician: Dr Balding Ozell Kussmaul is a 64 y.o. male with a history of HTN, OSA, atrial fibrillation who presents for follow up in the [facility].  The patient was initially diagnosed with atrial fibrillation 07/19/24 after presenting to the ED with symptoms of abdominal pain and swelling. 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 He was also found to be in rapid afib at the time. Anticoagulation was deferred given his recent abdominal bleeding. A monitor was ordered which showed 100% afib burden.     Patient presents today for follow up for atrial fibrillation. He remains in afib today. He had a sleep study which showed moderate OSA. He is fairly asymptomatic with his arrhythmia.   Today, he denies symptoms of palpitations, chest pain, shortness of breath, orthopnea, PND, lower extremity edema, dizziness, presyncope, syncope, bleeding, or neurologic sequela. The patient is tolerating medications without difficulties and is otherwise without complaint today.    Atrial Fibrillation Risk Factors:  he does have symptoms or diagnosis of sleep apnea. he does not have a history of rheumatic fever.   Atrial Fibrillation Management history:  Previous antiarrhythmic drugs: none Previous cardioversions: none Previous ablations: none Anticoagulation history: none  ROS- All systems are reviewed and negative except as per the HPI above.  Past Medical History:  Diagnosis Date   Hypertension    STATES BP HAS BEEN HIGH FOR QUIET SOME TIME, NO MEDS, NO PCP    Current Outpatient Medications  Medication Sig Dispense Refill   apixaban  (ELIQUIS ) 5 MG TABS tablet Take 1 tablet (5 mg total) by mouth 2 (two) times  daily. 60 tablet 3   ascorbic acid (VITAMIN C) 500 MG tablet Take 500 mg by mouth daily. (Patient not taking: Reported on 09/03/2024)     hydrochlorothiazide (HYDRODIURIL) 12.5 MG tablet Take 12.5 mg by mouth daily.     metoprolol  tartrate (LOPRESSOR ) 50 MG tablet Take 1 tablet (50 mg total) by mouth 3 (three) times daily. 270 tablet 0   olmesartan (BENICAR) 5 MG tablet Take 10 mg by mouth daily.     zinc gluconate 50 MG tablet Take 50 mg by mouth daily. (Patient not taking: Reported on 09/03/2024)     No current facility-administered medications for this visit.    Physical Exam: There were no vitals taken for this visit.  GEN: Well nourished, well developed in no acute distress CARDIAC: Irregularly irregular rate and rhythm, no murmurs, rubs, gallops RESPIRATORY:  Clear to auscultation without rales, wheezing or rhonchi  ABDOMEN: Soft, non-tender, non-distended EXTREMITIES:  No edema; No deformity   Wt Readings from Last 3 Encounters:  08/14/24 115.8 kg  07/21/24 121.1 kg  01/24/24 120.7 kg     EKG today demonstrates  Afib Vent. rate 90 BPM PR interval * ms QRS duration 102 ms QT/QTcB 360/440 ms   Echo 07/21/24 demonstrated   1. Left ventricular ejection fraction, by estimation, is 55 to 60%. The  left ventricle has normal function. The left ventricle demonstrates  regional wall motion abnormalities (see scoring diagram/findings for  description). The left ventricular internal cavity size was mildly to moderately dilated. Left ventricular diastolic  parameters are indeterminate.   2. Right ventricular systolic function is mildly reduced. The right  ventricular size is mildly enlarged. There is normal pulmonary artery  systolic pressure.   3. Left atrial size was severely dilated.   4. The mitral valve is normal in structure. Trivial mitral valve  regurgitation. No evidence of mitral stenosis.   5. The aortic valve is tricuspid. Aortic valve regurgitation is mild.  Aortic  valve sclerosis is present, with no evidence of aortic valve  stenosis.   6. There is mild dilatation of the ascending aorta, measuring 41 mm.   7. The inferior vena cava is dilated in size with <50% respiratory  variability, suggesting right atrial pressure of 15 mmHg.    CHA2DS2-VASc Score = 1  The patient's score is based upon: CHF History: 0 HTN History: 1 Diabetes History: 0 Stroke History: 0 Vascular Disease History: 0 Age Score: 0 Gender Score: 0   {Confirm score is correct.  If not, click here to update score.  REFRESH note.  :1}    ASSESSMENT AND PLAN: Persistent Atrial Fibrillation (ICD10:  I48.19) The patient's CHA2DS2-VASc score is 1, indicating a 0.6% annual risk of stroke.   Patient scheduled for DCCV tomorrow.  Continue Eliquis  5 mg BID, no missed doses in the past 3 weeks. Continue Lopressor  ***        ASSESSMENT AND PLAN: Persistent Atrial Fibrillation (ICD10:  I48.19) The patient's CHA2DS2-VASc score is 1, indicating a 0.6% annual risk of stroke.   Patient remains in rate controlled afib.  We discussed rhythm control options today. Will start Eliquis  5 mg BID and plan for DCCV after >3 weeks of anticoagulation.   Continue Lopressor  50 mg TID Check bmet/cbc today.   OSA  Encouraged nightly CPAP The importance of adequate treatment of sleep apnea was discussed today in order to improve our ability to maintain sinus rhythm long term.  HTN Stable on current regimen ***   Follow up ***   {Are you ordering a CV Procedure (e.g. stress test, cath, DCCV, TEE, etc)?   Press F2        :[id number]}  [facility] [address] [place], [place] [address] [phone] "

## 2024-10-08 ENCOUNTER — Encounter (HOSPITAL_COMMUNITY): Payer: Self-pay | Admitting: Cardiovascular Disease

## 2024-10-08 ENCOUNTER — Encounter (HOSPITAL_COMMUNITY)
Admission: RE | Disposition: A | Discharge status: Home / Self Care | Payer: Self-pay | Source: Home / Self Care | Attending: Cardiovascular Disease

## 2024-10-08 ENCOUNTER — Ambulatory Visit (HOSPITAL_COMMUNITY): Payer: Self-pay | Admitting: Anesthesiology

## 2024-10-08 ENCOUNTER — Ambulatory Visit (HOSPITAL_COMMUNITY)
Admission: RE | Admit: 2024-10-08 | Discharge: 2024-10-08 | Disposition: A | Discharge status: Home / Self Care | Payer: Self-pay | Attending: Cardiovascular Disease | Admitting: Cardiovascular Disease

## 2024-10-08 ENCOUNTER — Other Ambulatory Visit: Payer: Self-pay

## 2024-10-08 DIAGNOSIS — I10 Essential (primary) hypertension: Secondary | ICD-10-CM

## 2024-10-08 DIAGNOSIS — Z79899 Other long term (current) drug therapy: Secondary | ICD-10-CM | POA: Insufficient documentation

## 2024-10-08 DIAGNOSIS — Z7901 Long term (current) use of anticoagulants: Secondary | ICD-10-CM | POA: Insufficient documentation

## 2024-10-08 DIAGNOSIS — G4733 Obstructive sleep apnea (adult) (pediatric): Secondary | ICD-10-CM | POA: Insufficient documentation

## 2024-10-08 DIAGNOSIS — I4819 Other persistent atrial fibrillation: Secondary | ICD-10-CM

## 2024-10-08 DIAGNOSIS — I4891 Unspecified atrial fibrillation: Secondary | ICD-10-CM

## 2024-10-08 HISTORY — PX: CARDIOVERSION: EP1203

## 2024-10-08 LAB — POCT I-STAT, CHEM 8
BUN: 27 mg/dL — ABNORMAL HIGH (ref 8–23)
Calcium, Ion: 1.23 mmol/L (ref 1.15–1.40)
Chloride: 105 mmol/L (ref 98–111)
Creatinine, Ser: 1.1 mg/dL (ref 0.61–1.24)
Glucose, Bld: 105 mg/dL — ABNORMAL HIGH (ref 70–99)
HCT: 46 % (ref 39.0–52.0)
Hemoglobin: 15.6 g/dL (ref 13.0–17.0)
Potassium: 3.2 mmol/L — ABNORMAL LOW (ref 3.5–5.1)
Sodium: 142 mmol/L (ref 135–145)
TCO2: 23 mmol/L (ref 22–32)

## 2024-10-08 MED ORDER — LIDOCAINE 2% (20 MG/ML) 5 ML SYRINGE
INTRAMUSCULAR | Status: DC | PRN
  Administered 2024-10-08: 100 mg via INTRAVENOUS

## 2024-10-08 MED ORDER — SODIUM CHLORIDE 0.9 % IV SOLN: INTRAVENOUS | Status: DC

## 2024-10-08 MED ORDER — PROPOFOL 10 MG/ML IV BOLUS
INTRAVENOUS | Status: DC | PRN
  Administered 2024-10-08: 80 mg via INTRAVENOUS

## 2024-10-08 MED ORDER — POTASSIUM CHLORIDE CRYS ER 20 MEQ PO TBCR
EXTENDED_RELEASE_TABLET | ORAL | Status: AC
  Administered 2024-10-08: 40 meq via ORAL
  Filled 2024-10-08: qty 2

## 2024-10-08 MED ORDER — POTASSIUM CHLORIDE CRYS ER 20 MEQ PO TBCR: 40.0000 meq | EXTENDED_RELEASE_TABLET | Freq: Once | ORAL | Status: AC

## 2024-10-08 NOTE — Anesthesia Preprocedure Evaluation (Signed)
"                                    Anesthesia Evaluation  Patient identified by MRN, date of birth, ID band Patient awake    Reviewed: Allergy & Precautions, NPO status , Patient's Chart, lab work & pertinent test results  History of Anesthesia Complications Negative for: history of anesthetic complications  Airway Mallampati: III  TM Distance: >3 FB Neck ROM: Full    Dental no notable dental hx. (+) Teeth Intact   Pulmonary neg pulmonary ROS, neg sleep apnea, neg COPD, Patient abstained from smoking.Not current smoker   Pulmonary exam normal breath sounds clear to auscultation       Cardiovascular Exercise Tolerance: Good METShypertension, Pt. on medications (-) CAD and (-) Past MI + dysrhythmias Atrial Fibrillation  Rhythm:Irregular Rate:Normal - Systolic murmurs    Neuro/Psych negative neurological ROS  negative psych ROS   GI/Hepatic ,neg GERD  ,,(+)     (-) substance abuse    Endo/Other  neg diabetes    Renal/GU negative Renal ROS     Musculoskeletal   Abdominal   Peds  Hematology   Anesthesia Other Findings Past Medical History: No date: Hypertension     Comment:  STATES BP HAS BEEN HIGH FOR QUIET SOME TIME, NO MEDS, NO              PCP  Reproductive/Obstetrics                              Anesthesia Physical Anesthesia Plan  ASA: 3  Anesthesia Plan: General   Post-op Pain Management: Minimal or no pain anticipated   Induction: Intravenous  PONV Risk Score and Plan: 2 and Propofol  infusion, TIVA and Ondansetron   Airway Management Planned: Nasal Cannula  Additional Equipment: None  Intra-op Plan:   Post-operative Plan:   Informed Consent: I have reviewed the patients History and Physical, chart, labs and discussed the procedure including the risks, benefits and alternatives for the proposed anesthesia with the patient or authorized representative who has indicated his/her understanding and  acceptance.     Dental advisory given  Plan Discussed with: CRNA and Surgeon  Anesthesia Plan Comments: (Discussed risks of anesthesia with patient, including possibility of difficulty with spontaneous ventilation under anesthesia necessitating airway intervention, PONV, and rare risks such as cardiac or respiratory or neurological events, and allergic reactions. Discussed the role of CRNA in patient's perioperative care. Patient understands.)        Anesthesia Quick Evaluation  "

## 2024-10-08 NOTE — H&P (Signed)
 "   Primary Care Physician: Patient, No Pcp Per Primary Cardiologist: Jerel Balding, MD Electrophysiologist: Danelle Birmingham, MD  Referring Physician: Dr Balding Ozell Kussmaul is a 64 y.o. male with a history of HTN, OSA, atrial fibrillation who presents for follow up in the [facility].  The patient was initially diagnosed with atrial fibrillation 07/19/24 after presenting to the ED with symptoms of abdominal pain and swelling. 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 He was also found to be in rapid afib at the time. Anticoagulation was deferred given his recent abdominal bleeding. A monitor was ordered which showed 100% afib burden.     Patient presents today for follow up for atrial fibrillation. He remains in afib today. He had a sleep study which showed moderate OSA. He is fairly asymptomatic with his arrhythmia.   Today, he denies symptoms of palpitations, chest pain, shortness of breath, orthopnea, PND, lower extremity edema, dizziness, presyncope, syncope, bleeding, or neurologic sequela. The patient is tolerating medications without difficulties and is otherwise without complaint today.    Atrial Fibrillation Risk Factors:  he does have symptoms or diagnosis of sleep apnea. he does not have a history of rheumatic fever.   Atrial Fibrillation Management history:  Previous antiarrhythmic drugs: none Previous cardioversions: none Previous ablations: none Anticoagulation history: none  ROS- All systems are reviewed and negative except as per the HPI above.  Past Medical History:  Diagnosis Date   Hypertension    STATES BP HAS BEEN HIGH FOR QUIET SOME TIME, NO MEDS, NO PCP    Current Facility-Administered Medications  Medication Dose Route Frequency Provider Last Rate Last Admin   0.9 %  sodium chloride  infusion   Intravenous  Continuous Fenton, Clint R, PA        Physical Exam: BP 138/84   Pulse (!) 58   Temp 97.8 F (36.6 C) (Tympanic)   Resp 20   Ht 5' 6 (1.676 m)   Wt 108.9 kg   SpO2 95%   BMI 38.74 kg/m   GEN: Well nourished, well developed in no acute distress CARDIAC: Irregularly irregular rate and rhythm, no murmurs, rubs, gallops RESPIRATORY:  Clear to auscultation without rales, wheezing or rhonchi  ABDOMEN: Soft, non-tender, non-distended EXTREMITIES:  No edema; No deformity   Wt Readings from Last 3 Encounters:  10/08/24 108.9 kg  08/14/24 115.8 kg  07/21/24 121.1 kg     EKG today demonstrates  Afib Vent. rate 90 BPM PR interval * ms QRS duration 102 ms QT/QTcB 360/440 ms   Echo 07/21/24 demonstrated   1. Left ventricular ejection fraction, by estimation, is 55 to 60%. The  left ventricle has normal function. The left ventricle demonstrates  regional wall motion abnormalities (see scoring diagram/findings for  description). The left ventricular internal cavity size was mildly to moderately dilated. Left ventricular diastolic  parameters are indeterminate.   2. Right ventricular systolic function is mildly reduced. The right  ventricular size is mildly enlarged. There is normal pulmonary artery  systolic pressure.   3. Left atrial size was severely dilated.   4. The mitral valve is normal in structure. Trivial mitral valve  regurgitation. No evidence of mitral stenosis.   5. The aortic valve is tricuspid. Aortic valve regurgitation is mild.  Aortic valve sclerosis is present, with no evidence of aortic valve  stenosis.  6. There is mild dilatation of the ascending aorta, measuring 41 mm.   7. The inferior vena cava is dilated in size with <50% respiratory  variability, suggesting right atrial pressure of 15 mmHg.    CHA2DS2-VASc Score = 1  The patient's score is based upon: CHF History: 0 HTN History: 1 Diabetes History: 0 Stroke History: 0 Vascular Disease History:  0 Age Score: 0 Gender Score: 0       ASSESSMENT AND PLAN: Persistent Atrial Fibrillation (ICD10:  I48.19) The patient's CHA2DS2-VASc score is 1, indicating a 0.6% annual risk of stroke.   Patient remains in rate controlled afib.  We discussed rhythm control options today. Will start Eliquis  5 mg BID and plan for DCCV after >3 weeks of anticoagulation.   Continue Lopressor  50 mg TID Check bmet/cbc today.   OSA  Encouraged nightly CPAP The importance of adequate treatment of sleep apnea was discussed today in order to improve our ability to maintain sinus rhythm long term.  HTN Elevated today. Will reassess in SR.   Follow up in the AF clinic post DCCV.    Informed Consent   Shared Decision Making/Informed Consent The risks (stroke, cardiac arrhythmias rarely resulting in the need for a temporary or permanent pacemaker, skin irritation or burns and complications associated with conscious sedation including aspiration, arrhythmia, respiratory failure and death), benefits (restoration of normal sinus rhythm) and alternatives of a direct current cardioversion were explained in detail to Mr. Sharp and he agrees to proceed.       [facility] [address] [place], [place] [address] [phone] "

## 2024-10-08 NOTE — CV Procedure (Signed)
 DCC: On Rx eliquis  with no missed doses NPO Anesthesia:  Propofol   DCC x 2 200 and 250J's  Converted from afib rate 110 to NSR rate 68 bpm  No immediate neurologic sequelae  Maude Emmer MD [place]

## 2024-10-08 NOTE — Transfer of Care (Signed)
 Immediate Anesthesia Transfer of Care Note  Patient: Marcus Scott  Procedure(s) Performed: CARDIOVERSION  Patient Location: Cath Lab  Anesthesia Type:General  Level of Consciousness: awake, alert , and oriented  Airway & Oxygen Therapy: Patient Spontanous Breathing and Patient connected to nasal cannula oxygen  Post-op Assessment: Report given to RN and Post -op Vital signs reviewed and stable  Post vital signs: Reviewed and stable  Last Vitals:  Vitals Value Taken Time  BP 148/94 0930  Temp 97 0930  Pulse 61 0930  Resp 16 0930  SpO2 95 0930    Last Pain:  Vitals:   10/08/24 0829  TempSrc: Temporal         Complications: No notable events documented.

## 2024-10-08 NOTE — Discharge Instructions (Signed)

## 2024-10-09 ENCOUNTER — Telehealth (HOSPITAL_COMMUNITY): Payer: Self-pay | Admitting: *Deleted

## 2024-10-09 ENCOUNTER — Encounter (HOSPITAL_COMMUNITY): Payer: Self-pay | Admitting: Cardiovascular Disease

## 2024-10-09 MED ORDER — FUROSEMIDE 20 MG PO TABS: ORAL_TABLET | ORAL | 0 refills | Status: DC

## 2024-10-09 MED ORDER — POTASSIUM CHLORIDE CRYS ER 20 MEQ PO TBCR: EXTENDED_RELEASE_TABLET | ORAL | 0 refills | Status: DC

## 2024-10-09 NOTE — Telephone Encounter (Signed)
 Pt had dccv yesterday - last night noticed some shortness of breath abdominal fullness and had orthopnea overnight. He does not have scale to weigh and denies swelling. Discussed with Daril Kicks PA will give lasix  20mg  daily for 3 days along with Kdur 20meq daily for 3 days. Pt in agreement and will call if further issues arise.

## 2024-10-10 NOTE — Anesthesia Postprocedure Evaluation (Signed)
"   Anesthesia Post Note  Patient: Marcus Scott  Procedure(s) Performed: CARDIOVERSION     Patient location during evaluation: Cath Lab Anesthesia Type: General Level of consciousness: awake and alert Pain management: pain level controlled Vital Signs Assessment: post-procedure vital signs reviewed and stable Respiratory status: spontaneous breathing, nonlabored ventilation, respiratory function stable and patient connected to nasal cannula oxygen Cardiovascular status: blood pressure returned to baseline and stable Postop Assessment: no apparent nausea or vomiting Anesthetic complications: no   There were no known notable events for this encounter.  Last Vitals:  Vitals:   10/08/24 0943 10/08/24 0953  BP: (!) 145/87 (!) 166/98  Pulse: 66 65  Resp: 20 16  Temp:    SpO2: 95% 96%    Last Pain:  Vitals:   10/08/24 0953  TempSrc:   PainSc: 0-No pain                 Devarious Pavek L Sylis Ketchum      "

## 2024-10-13 ENCOUNTER — Other Ambulatory Visit (HOSPITAL_COMMUNITY): Payer: Self-pay | Admitting: *Deleted

## 2024-10-13 MED ORDER — POTASSIUM CHLORIDE CRYS ER 20 MEQ PO TBCR: 20.0000 meq | EXTENDED_RELEASE_TABLET | Freq: Every day | ORAL | 0 refills | Status: AC

## 2024-10-13 MED ORDER — FUROSEMIDE 20 MG PO TABS: 20.0000 mg | ORAL_TABLET | Freq: Every day | ORAL | 0 refills | Status: AC

## 2024-10-13 NOTE — Telephone Encounter (Signed)
 Pt called reporting SOB and swelling to stomach. Unable to lie down flat to sleep. Wt up per pt from 241 lbs to 252 lbs. Discussed with R.Fenton P.A. he wants pt to take Lasix  20 mg once daily x 5 along Potassium 20 meq once daily x 5 days. He also wants to pt to follow up with Dr.Croitoru. I will send message to his office to make appt. I reviewed all instructions with pt he agrees with plan.

## 2024-10-20 ENCOUNTER — Telehealth: Payer: Self-pay | Admitting: Cardiology

## 2024-10-20 NOTE — Telephone Encounter (Signed)
 Called pt to sch 3 mon f/u per afib clinic with Dr. Shlomo for Sleep apnea. Pt needs a CPAP titration. Per last phone note, pt states he will c/b when ready to get that set up. LVM for him to c/b to discuss again.

## 2024-10-22 ENCOUNTER — Ambulatory Visit (HOSPITAL_COMMUNITY)
Admission: RE | Admit: 2024-10-22 | Discharge: 2024-10-22 | Disposition: A | Discharge status: Home / Self Care | Payer: Self-pay | Source: Ambulatory Visit | Attending: Physician Assistant | Admitting: Physician Assistant

## 2024-10-22 VITALS — BP 170/104 | HR 71 | Ht 66.0 in | Wt 252.1 lb

## 2024-10-22 DIAGNOSIS — I10 Essential (primary) hypertension: Secondary | ICD-10-CM

## 2024-10-22 DIAGNOSIS — I4891 Unspecified atrial fibrillation: Secondary | ICD-10-CM

## 2024-10-22 DIAGNOSIS — I4819 Other persistent atrial fibrillation: Secondary | ICD-10-CM

## 2024-10-22 MED ORDER — OLMESARTAN MEDOXOMIL 20 MG PO TABS: 20.0000 mg | ORAL_TABLET | Freq: Every day | ORAL | 2 refills | Status: AC

## 2024-10-22 MED ORDER — METOPROLOL TARTRATE 50 MG PO TABS: 50.0000 mg | ORAL_TABLET | Freq: Two times a day (BID) | ORAL | 1 refills | Status: AC

## 2024-10-22 MED ORDER — APIXABAN 5 MG PO TABS: 5.0000 mg | ORAL_TABLET | Freq: Two times a day (BID) | ORAL | Status: AC

## 2024-10-22 NOTE — Progress Notes (Signed)
 "   Primary Care Physician: Patient, No Pcp Per Primary Cardiologist: Jerel Balding, MD Electrophysiologist: Danelle Birmingham, MD  Referring Physician: Dr Balding Ozell Kussmaul is a 64 y.o. male with a history of HTN, OSA, atrial fibrillation who presents for follow up in the [facility].  The patient was initially diagnosed with atrial fibrillation 07/19/24 after presenting to the ED with symptoms of abdominal pain and swelling. 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 He was also found to be in rapid afib at the time. Anticoagulation was deferred given his recent abdominal bleeding. A monitor was ordered which showed 100% afib burden. He was started on Eliquis  for stroke prevention and underwent DCCV on 10/08/24.  He called the clinic 10/09/24 with SOB, abdominal fullness, and orthopnea. He was given Lasix  and KCL x 7 days which resolved his symptoms.    Patient returns for follow up for atrial fibrillation. He remains in SR today. He has noticed high BP readings at home in the 160s systolic range. He also has had some fatigue which he attributes to the BB. No bleeding issues on anticoagulation.   Today, he  denies symptoms of palpitations, chest pain, shortness of breath, orthopnea, PND, lower extremity edema, dizziness, presyncope, syncope, bleeding, or neurologic sequela. The patient is tolerating medications without difficulties and is otherwise without complaint today.    Atrial Fibrillation Risk Factors:  he does have symptoms or diagnosis of sleep apnea. he does not have a history of rheumatic fever.   Atrial Fibrillation Management history:  Previous antiarrhythmic drugs: none Previous cardioversions: 10/08/24 Previous ablations: none Anticoagulation history: Eliquis   ROS- All systems are reviewed and negative except as per the HPI  above.  Past Medical History:  Diagnosis Date   Hypertension    STATES BP HAS BEEN HIGH FOR QUIET SOME TIME, NO MEDS, NO PCP    Current Outpatient Medications  Medication Sig Dispense Refill   zinc gluconate 50 MG tablet Take 50 mg by mouth daily.     apixaban  (ELIQUIS ) 5 MG TABS tablet Take 1 tablet (5 mg total) by mouth 2 (two) times daily.     ascorbic acid (VITAMIN C) 500 MG tablet Take 500 mg by mouth daily. (Patient not taking: Reported on 10/22/2024)     furosemide  (LASIX ) 20 MG tablet Take 1 tablet (20 mg total) by mouth daily. (Patient not taking: Reported on 10/22/2024) 5 tablet 0   metoprolol  tartrate (LOPRESSOR ) 50 MG tablet Take 1 tablet (50 mg total) by mouth 2 (two) times daily. 180 tablet 1   olmesartan  (BENICAR ) 20 MG tablet Take 1 tablet (20 mg total) by mouth daily. 30 tablet 2   potassium chloride  SA (KLOR-CON  M) 20 MEQ tablet Take 1 tablet (20 mEq total) by mouth daily. Take with lasix  for 5 days (Patient not taking: Reported on 10/22/2024) 5 tablet 0   No current facility-administered medications for this encounter.    Physical Exam: BP (!) 170/104   Pulse 71   Ht 5' 6 (1.676 m)   Wt 114.4 kg   BMI 40.69 kg/m   GEN: Well nourished, well developed in no acute distress CARDIAC: Regular rate and rhythm, no murmurs, rubs, gallops RESPIRATORY:  Clear to auscultation without rales, wheezing or rhonchi  ABDOMEN: Soft, non-tender, non-distended EXTREMITIES:  No edema; No deformity    Wt Readings from  Last 3 Encounters:  10/22/24 114.4 kg  10/08/24 108.9 kg  08/14/24 115.8 kg     EKG Interpretation Date/Time:  Wednesday October 22 2024 14:19:22 EST Ventricular Rate:  71 PR Interval:  162 QRS Duration:  102 QT Interval:  428 QTC Calculation: 465 R Axis:   -41  Text Interpretation: Normal sinus rhythm Left axis deviation Cannot rule out Anterior infarct , age undetermined Abnormal ECG When compared with ECG of 08-Oct-2024 09:34, No significant change was  found Confirmed by Brianna Bennett ([id number]) on 10/22/2024 2:24:18 PM    Echo 07/21/24 demonstrated   1. Left ventricular ejection fraction, by estimation, is 55 to 60%. The  left ventricle has normal function. The left ventricle demonstrates  regional wall motion abnormalities (see scoring diagram/findings for  description). The left ventricular internal cavity size was mildly to moderately dilated. Left ventricular diastolic parameters are indeterminate.   2. Right ventricular systolic function is mildly reduced. The right  ventricular size is mildly enlarged. There is normal pulmonary artery  systolic pressure.   3. Left atrial size was severely dilated.   4. The mitral valve is normal in structure. Trivial mitral valve  regurgitation. No evidence of mitral stenosis.   5. The aortic valve is tricuspid. Aortic valve regurgitation is mild.  Aortic valve sclerosis is present, with no evidence of aortic valve  stenosis.   6. There is mild dilatation of the ascending aorta, measuring 41 mm.   7. The inferior vena cava is dilated in size with <50% respiratory  variability, suggesting right atrial pressure of 15 mmHg.    CHA2DS2-VASc Score = 1  The patient's score is based upon: CHF History: 0 HTN History: 1 Diabetes History: 0 Stroke History: 0 Vascular Disease History: 0 Age Score: 0 Gender Score: 0       ASSESSMENT AND PLAN: Persistent Atrial Fibrillation (ICD10:  I48.19) The patient's CHA2DS2-VASc score is 1, indicating a 0.6% annual risk of stroke.   S/p DCCV on 10/08/24 Patient appears to be maintaining SR. Discussed risk and benefits of long term anticoagulation with patient. He would prefer to come off anticoagulation. Continue Eliquis  5 mg BID for 4 weeks post DCCV then discontinue with low CV score.  Decrease Lopressor  to 50 mg BID to see if this helps fatigue.   OSA  Pending CPAP titration.  HTN Elevated today and at home.  Suspect untreated OSA contributing. Will  increase olmesartan  to 20 mg daily Will have patient follow up with primary cardiology team in 3-4 weeks.    Follow up with Dr Francyne or APP in 3-4 weeks.    [facility] [address] [place], [place] [address] [phone] "

## 2024-10-22 NOTE — Patient Instructions (Addendum)
 Increase olmesartan  to 20mg  once a day    Decrease metoprolol  to 50mg  twice a day   On February 4th - STOP Eliquis    Follow up with Dr Tyrone office in 3-4 weeks - office will contact you [phone])
